# Patient Record
Sex: Male | Born: 1975 | Hispanic: Yes | Marital: Married | Smoking: Never smoker
Health system: Southern US, Community
[De-identification: ages and names within clinical notes are randomized; demographics above are authoritative.]

## PROBLEM LIST (undated history)

## (undated) DIAGNOSIS — M5430 Sciatica, unspecified side: Secondary | ICD-10-CM

## (undated) DIAGNOSIS — M1990 Unspecified osteoarthritis, unspecified site: Secondary | ICD-10-CM

## (undated) DIAGNOSIS — I10 Essential (primary) hypertension: Secondary | ICD-10-CM

## (undated) DIAGNOSIS — E785 Hyperlipidemia, unspecified: Secondary | ICD-10-CM

## (undated) DIAGNOSIS — J45909 Unspecified asthma, uncomplicated: Secondary | ICD-10-CM

## (undated) HISTORY — DX: Hyperlipidemia, unspecified: E78.5

## (undated) HISTORY — DX: Sciatica, unspecified side: M54.30

## (undated) HISTORY — PX: CHOLECYSTECTOMY: SHX55

## (undated) HISTORY — DX: Essential (primary) hypertension: I10

## (undated) HISTORY — DX: Unspecified asthma, uncomplicated: J45.909

## (undated) HISTORY — DX: Unspecified osteoarthritis, unspecified site: M19.90

---

## 2012-05-12 ENCOUNTER — Emergency Department: Payer: Self-pay | Admitting: Emergency Medicine

## 2012-05-12 LAB — COMPREHENSIVE METABOLIC PANEL
Albumin: 4 g/dL (ref 3.4–5.0)
Anion Gap: 13 (ref 7–16)
BUN: 15 mg/dL (ref 7–18)
Calcium, Total: 8.6 mg/dL (ref 8.5–10.1)
Chloride: 109 mmol/L — ABNORMAL HIGH (ref 98–107)
Co2: 21 mmol/L (ref 21–32)
EGFR (African American): >60
Glucose: 111 mg/dL — ABNORMAL HIGH (ref 65–99)
Osmolality: 287 (ref 275–301)
Potassium: 4.2 mmol/L (ref 3.5–5.1)
SGOT(AST): 60 U/L — ABNORMAL HIGH (ref 15–37)
SGPT (ALT): 41 U/L (ref 12–78)
Sodium: 143 mmol/L (ref 136–145)
Total Protein: 7.4 g/dL (ref 6.4–8.2)

## 2012-05-12 LAB — CBC
HGB: 13.7 g/dL (ref 13.0–18.0)
MCV: 90 fL (ref 80–100)
RBC: 4.58 10*6/uL (ref 4.40–5.90)
WBC: 7.4 10*3/uL (ref 3.8–10.6)

## 2012-05-12 LAB — LIPASE, BLOOD: Lipase: 218 U/L (ref 73–393)

## 2012-06-14 ENCOUNTER — Ambulatory Visit: Payer: Self-pay | Admitting: Surgery

## 2012-06-14 LAB — HEPATIC FUNCTION PANEL A (ARMC)
Albumin: 4.2 g/dL (ref 3.4–5.0)
Bilirubin, Direct: <0.05 mg/dL (ref 0.00–0.20)
Bilirubin,Total: 0.8 mg/dL (ref 0.2–1.0)
SGPT (ALT): 28 U/L (ref 12–78)
Total Protein: 7.6 g/dL (ref 6.4–8.2)

## 2012-06-18 ENCOUNTER — Ambulatory Visit: Payer: Self-pay | Admitting: Surgery

## 2012-06-19 LAB — PATHOLOGY REPORT

## 2013-04-19 ENCOUNTER — Ambulatory Visit: Payer: Self-pay

## 2013-04-24 ENCOUNTER — Ambulatory Visit: Payer: Self-pay

## 2014-11-11 NOTE — Op Note (Signed)
PATIENT NAME:  Angel Montes, Angel Montes MR#:  [id number] DATE OF BIRTH:  1975-12-16  DATE OF PROCEDURE:  06/18/2012  OPERATION PERFORMED: Laparoscopic cholecystectomy.   PREOPERATIVE DIAGNOSIS: Symptomatic cholelithiasis.   POSTOPERATIVE DIAGNOSIS: Symptomatic cholelithiasis.   SURGEON: Consuela Mimes, MD   ANESTHESIA: General.   PROCEDURE IN DETAIL: The patient was placed supine on the operating room table and prepped and draped in the usual sterile fashion. A 15 mmHg CO2 pneumoperitoneum was created via a Veress needle in the infraumbilical position and this was replaced with a 5 mm trocar and a 30 degree angled laparoscope. Remaining trocars were placed under direct visualization. The fundus of the gallbladder was retracted superiorly and ventrally and there was a significant amount of fat surrounding the infundibulum and this was dissected off and then the infundibulum was retracted laterally opening up the triangle of Calot. The infundibulum coned down to a cystic duct with no clear gallbladder cystic duct junction but after the cystic duct was fairly small and it was clear that that was not the common bile duct, it was doubly clipped and divided and then the cystic artery was doubly clipped and divided and an additional third clip was placed on a posterior cystic artery branch. The gallbladder was removed from the liver bed with electrocautery, placed in an EndoCatch bag, and extracted from the abdomen via the epigastric port. This port site fascia was then closed with a laparoscopic puncture closure device, 0 Vicryl, and the right upper quadrant was irrigated with warm normal saline. This was suctioned clear. Hemostasis was excellent. There was no bile staining and the clips were secure. Therefore, the peritoneum was desufflated and decannulated and all four skin sites were closed with subcuticular 5-0 Monocryl and suture strips. The patient tolerated the procedure well. There were no complications.    ____________________________ Consuela Mimes, MD wfm:[id number] D: 06/18/2012 09:00:59 ET T: 06/18/2012 10:20:33 ET JOB#: [id number] cc: Consuela Mimes, MD, <Dictator> Consuela Mimes MD ELECTRONICALLY SIGNED 06/20/2012 21:15

## 2016-12-06 DIAGNOSIS — E785 Hyperlipidemia, unspecified: Secondary | ICD-10-CM | POA: Insufficient documentation

## 2016-12-06 DIAGNOSIS — I10 Essential (primary) hypertension: Secondary | ICD-10-CM | POA: Insufficient documentation

## 2017-01-05 ENCOUNTER — Encounter: Payer: Self-pay | Admitting: Family Medicine

## 2017-01-05 ENCOUNTER — Ambulatory Visit (INDEPENDENT_AMBULATORY_CARE_PROVIDER_SITE_OTHER): Payer: BC Managed Care – PPO | Admitting: Family Medicine

## 2017-01-05 VITALS — BP 118/70 | HR 78 | Temp 98.7°F | Ht 65.1 in | Wt 186.2 lb

## 2017-01-05 DIAGNOSIS — Z113 Encounter for screening for infections with a predominantly sexual mode of transmission: Secondary | ICD-10-CM

## 2017-01-05 DIAGNOSIS — I10 Essential (primary) hypertension: Secondary | ICD-10-CM

## 2017-01-05 DIAGNOSIS — E782 Mixed hyperlipidemia: Secondary | ICD-10-CM

## 2017-01-05 NOTE — Progress Notes (Signed)
BP 118/70   Pulse 78   Temp 98.7 F (37.1 C)   Ht 5' 5.1" (1.654 m)   Wt 186 lb 3.2 oz (84.5 kg)   SpO2 97%   BMI 30.89 kg/m    Subjective:    Patient ID: Angel Montes, male    DOB: 09-Oct-1975, 41 y.o.   MRN: [id number]  HPI: Nial Hawe is a 41 y.o. male who presents today to establish care. He has not seen a regular doctor in about 3 years  Chief Complaint  Patient presents with  . Establish Care   HYPERTENSION / Backus Satisfied with current treatment? yes Duration of hypertension: chronic BP monitoring frequency: not checking BP medication side effects: Not on anything Past BP meds: none Duration of hyperlipidemia: chronic Cholesterol medication side effects: no Cholesterol supplements: none Past cholesterol medications: fenofibrate (tricor), niaspan and lovaza Medication compliance: Not on anything in 3 years Aspirin: no Recent stressors: no Recurrent headaches: no Visual changes: no Palpitations: no Dyspnea: no Chest pain: no Lower extremity edema: no Dizzy/lightheaded: no   Active Ambulatory Problems    Diagnosis Date Noted  . Hyperlipidemia   . Hypertension    Resolved Ambulatory Problems    Diagnosis Date Noted  . No Resolved Ambulatory Problems   Past Medical History:  Diagnosis Date  . Asthma   . Hyperlipidemia   . Hypertension   . Sciatica    Past Surgical History:  Procedure Laterality Date  . CHOLECYSTECTOMY     No outpatient encounter prescriptions on file as of 01/05/2017.   No facility-administered encounter medications on file as of 01/05/2017.    No Known Allergies  Family History  Problem Relation Age of Onset  . Hypertension Mother    Social History   Social History  . Marital status: Married    Spouse name: N/A  . Number of children: N/A  . Years of education: N/A   Occupational History  . Not on file.   Social History Main Topics  . Smoking status: Never Smoker  . Smokeless tobacco: Never Used    . Alcohol use Yes     Comment: On occasion/ socially  . Drug use: No  . Sexual activity: Yes   Other Topics Concern  . Not on file   Social History Narrative  . No narrative on file    Review of Systems  Constitutional: Negative.   Respiratory: Negative.   Cardiovascular: Negative.   Psychiatric/Behavioral: Negative.     Per HPI unless specifically indicated above     Objective:    BP 118/70   Pulse 78   Temp 98.7 F (37.1 C)   Ht 5' 5.1" (1.654 m)   Wt 186 lb 3.2 oz (84.5 kg)   SpO2 97%   BMI 30.89 kg/m   Wt Readings from Last 3 Encounters:  01/05/17 186 lb 3.2 oz (84.5 kg)    Physical Exam  Constitutional: He is oriented to person, place, and time. He appears well-developed and well-nourished. No distress.  HENT:  Head: Normocephalic and atraumatic.  Right Ear: Hearing normal.  Left Ear: Hearing normal.  Nose: Nose normal.  Eyes: Conjunctivae and lids are normal. Right eye exhibits no discharge. Left eye exhibits no discharge. No scleral icterus.  Cardiovascular: Normal rate, regular rhythm, normal heart sounds and intact distal pulses.  Exam reveals no gallop and no friction rub.   No murmur heard. Pulmonary/Chest: Effort normal and breath sounds normal. No respiratory distress. He has no wheezes. He has  no rales. He exhibits no tenderness.  Musculoskeletal: Normal range of motion.  Neurological: He is alert and oriented to person, place, and time.  Skin: Skin is warm, dry and intact. No rash noted. He is not diaphoretic. No erythema. No pallor.  Psychiatric: He has a normal mood and affect. His speech is normal and behavior is normal. Judgment and thought content normal. Cognition and memory are normal.  Nursing note and vitals reviewed.   Results for orders placed or performed in visit on 05/12/12  Comprehensive metabolic panel  Result Value Ref Range   Glucose 111 (H) 65 - 99 mg/dL   BUN 15 7 - 18 mg/dL   Creatinine 0.84 0.60 - 1.30 mg/dL   Sodium  143 136 - 145 mmol/L   Potassium 4.2 3.5 - 5.1 mmol/L   Chloride 109 (H) 98 - 107 mmol/L   Co2 21 21 - 32 mmol/L   Calcium, Total 8.6 8.5 - 10.1 mg/dL   SGOT(AST) 60 (H) 15 - 37 Unit/L   SGPT (ALT) 41 12 - 78 U/L   Alkaline Phosphatase 75 50 - 136 Unit/L   Albumin 4.0 3.4 - 5.0 g/dL   Total Protein 7.4 6.4 - 8.2 g/dL   Bilirubin,Total 0.4 0.2 - 1.0 mg/dL   Osmolality 287 275 - 301   Anion Gap 13 7 - 16   EGFR (African American) >60    EGFR (Non-African Amer.) >60   CBC  Result Value Ref Range   WBC 7.4 3.8 - 10.6 x10 3/mm 3   RBC 4.58 4.40 - 5.90 x10 6/mm 3   HGB 13.7 13.0 - 18.0 g/dL   HCT 41.3 40.0 - 52.0 %   MCV 90 80 - 100 fL   MCH 29.9 26.0 - 34.0 pg   MCHC 33.2 32.0 - 36.0 g/dL   RDW 13.5 11.5 - 14.5 %   Platelet 246 150 - 440 x10 3/mm 3  Lipase, blood  Result Value Ref Range   Lipase 218 73 - 393 Unit/L  Troponin I  Result Value Ref Range   Troponin-I < 0.02 ng/mL  CK total and CKMB (cardiac)  Result Value Ref Range   CK, Total 227 35 - 232 Unit/L   CK-MB < 0.5 (L) 0.5 - 3.6 ng/mL      Assessment & Plan:   Problem List Items Addressed This Visit      Cardiovascular and Mediastinum   Hypertension - Primary    Under good control on recheck- will check microalbumin and recheck at follow up. Work on [employer].       Relevant Orders   CBC with Differential/Platelet   Comprehensive metabolic panel   Microalbumin, Urine Waived   TSH   UA/M w/rflx Culture, Routine     Other   Hyperlipidemia    Ate today. Will return tomorrow for blood work and will treat as needed. Call with any concerns.       Relevant Orders   CBC with Differential/Platelet   Comprehensive metabolic panel   Lipid Panel w/o Chol/HDL Ratio    Other Visit Diagnoses    Screening for STD (sexually transmitted disease)       Labs to be done tomorrow. Call with any concerns.    Relevant Orders   HIV antibody       Follow up plan: Return 1-3 months, for Physical.

## 2017-01-05 NOTE — Patient Instructions (Addendum)
DASH Eating Plan DASH stands for "Dietary Approaches to Stop Hypertension." The DASH eating plan is a healthy eating plan that has been shown to reduce high blood pressure (hypertension). It may also reduce your risk for type 2 diabetes, heart disease, and stroke. The DASH eating plan may also help with weight loss. What are tips for following this plan? General guidelines  Avoid eating more than 2,300 mg (milligrams) of salt (sodium) a day. If you have hypertension, you may need to reduce your sodium intake to 1,500 mg a day.  Limit alcohol intake to no more than 1 drink a day for nonpregnant women and 2 drinks a day for men. One drink equals 12 oz of beer, 5 oz of wine, or 1 oz of hard liquor.  Work with your health care provider to maintain a healthy body weight or to lose weight. Ask what an ideal weight is for you.  Get at least 30 minutes of exercise that causes your heart to beat faster (aerobic exercise) most days of the week. Activities may include walking, swimming, or biking.  Work with your health care provider or diet and nutrition specialist (dietitian) to adjust your eating plan to your individual calorie needs. Reading food labels  Check food labels for the amount of sodium per serving. Choose foods with less than 5 percent of the Daily Value of sodium. Generally, foods with less than 300 mg of sodium per serving fit into this eating plan.  To find whole grains, look for the word "whole" as the first word in the ingredient list. Shopping  Buy products labeled as "low-sodium" or "no salt added."  Buy fresh foods. Avoid canned foods and premade or frozen meals. Cooking  Avoid adding salt when cooking. Use salt-free seasonings or herbs instead of table salt or sea salt. Check with your health care provider or pharmacist before using salt substitutes.  Do not fry foods. Cook foods using healthy methods such as baking, boiling, grilling, and broiling instead.  Cook with  heart-healthy oils, such as olive, canola, soybean, or sunflower oil. Meal planning   Eat a balanced diet that includes: ? 5 or more servings of fruits and vegetables each day. At each meal, try to fill half of your plate with fruits and vegetables. ? Up to 6-8 servings of whole grains each day. ? Less than 6 oz of lean meat, poultry, or fish each day. A 3-oz serving of meat is about the same size as a deck of cards. One egg equals 1 oz. ? 2 servings of low-fat dairy each day. ? A serving of nuts, seeds, or beans 5 times each week. ? Heart-healthy fats. Healthy fats called Omega-3 fatty acids are found in foods such as flaxseeds and coldwater fish, like sardines, salmon, and mackerel.  Limit how much you eat of the following: ? Canned or prepackaged foods. ? Food that is high in trans fat, such as fried foods. ? Food that is high in saturated fat, such as fatty meat. ? Sweets, desserts, sugary drinks, and other foods with added sugar. ? Full-fat dairy products.  Do not salt foods before eating.  Try to eat at least 2 vegetarian meals each week.  Eat more home-cooked food and less restaurant, buffet, and fast food.  When eating at a restaurant, ask that your food be prepared with less salt or no salt, if possible. What foods are recommended? The items listed may not be a complete list. Talk with your dietitian about what   dietary choices are best for you. Grains Whole-grain or whole-wheat bread. Whole-grain or whole-wheat pasta. Brown rice. Oatmeal. Quinoa. Bulgur. Whole-grain and low-sodium cereals. Pita bread. Low-fat, low-sodium crackers. Whole-wheat flour tortillas. Vegetables Fresh or frozen vegetables (raw, steamed, roasted, or grilled). Low-sodium or reduced-sodium tomato and vegetable juice. Low-sodium or reduced-sodium tomato sauce and tomato paste. Low-sodium or reduced-sodium canned vegetables. Fruits All fresh, dried, or frozen fruit. Canned fruit in natural juice (without  added sugar). Meat and other protein foods Skinless chicken or turkey. Ground chicken or turkey. Pork with fat trimmed off. Fish and seafood. Egg whites. Dried beans, peas, or lentils. Unsalted nuts, nut butters, and seeds. Unsalted canned beans. Lean cuts of beef with fat trimmed off. Low-sodium, lean deli meat. Dairy Low-fat (1%) or fat-free (skim) milk. Fat-free, low-fat, or reduced-fat cheeses. Nonfat, low-sodium ricotta or cottage cheese. Low-fat or nonfat yogurt. Low-fat, low-sodium cheese. Fats and oils Soft margarine without trans fats. Vegetable oil. Low-fat, reduced-fat, or light mayonnaise and salad dressings (reduced-sodium). Canola, safflower, olive, soybean, and sunflower oils. Avocado. Seasoning and other foods Herbs. Spices. Seasoning mixes without salt. Unsalted popcorn and pretzels. Fat-free sweets. What foods are not recommended? The items listed may not be a complete list. Talk with your dietitian about what dietary choices are best for you. Grains Baked goods made with fat, such as croissants, muffins, or some breads. Dry pasta or rice meal packs. Vegetables Creamed or fried vegetables. Vegetables in a cheese sauce. Regular canned vegetables (not low-sodium or reduced-sodium). Regular canned tomato sauce and paste (not low-sodium or reduced-sodium). Regular tomato and vegetable juice (not low-sodium or reduced-sodium). Pickles. Olives. Fruits Canned fruit in a light or heavy syrup. Fried fruit. Fruit in cream or butter sauce. Meat and other protein foods Fatty cuts of meat. Ribs. Fried meat. Bacon. Sausage. Bologna and other processed lunch meats. Salami. Fatback. Hotdogs. Bratwurst. Salted nuts and seeds. Canned beans with added salt. Canned or smoked fish. Whole eggs or egg yolks. Chicken or turkey with skin. Dairy Whole or 2% milk, cream, and half-and-half. Whole or full-fat cream cheese. Whole-fat or sweetened yogurt. Full-fat cheese. Nondairy creamers. Whipped toppings.  Processed cheese and cheese spreads. Fats and oils Butter. Stick margarine. Lard. Shortening. Ghee. Bacon fat. Tropical oils, such as coconut, palm kernel, or palm oil. Seasoning and other foods Salted popcorn and pretzels. Onion salt, garlic salt, seasoned salt, table salt, and sea salt. Worcestershire sauce. Tartar sauce. Barbecue sauce. Teriyaki sauce. Soy sauce, including reduced-sodium. Steak sauce. Canned and packaged gravies. Fish sauce. Oyster sauce. Cocktail sauce. Horseradish that you find on the shelf. Ketchup. Mustard. Meat flavorings and tenderizers. Bouillon cubes. Hot sauce and Tabasco sauce. Premade or packaged marinades. Premade or packaged taco seasonings. Relishes. Regular salad dressings. Where to find more information:  National Heart, Lung, and Blood Institute: www.nhlbi.nih.gov  American Heart Association: www.heart.org Summary  The DASH eating plan is a healthy eating plan that has been shown to reduce high blood pressure (hypertension). It may also reduce your risk for type 2 diabetes, heart disease, and stroke.  With the DASH eating plan, you should limit salt (sodium) intake to 2,300 mg a day. If you have hypertension, you may need to reduce your sodium intake to 1,500 mg a day.  When on the DASH eating plan, aim to eat more fresh fruits and vegetables, whole grains, lean proteins, low-fat dairy, and heart-healthy fats.  Work with your health care provider or diet and nutrition specialist (dietitian) to adjust your eating plan to your individual   calorie needs. This information is not intended to replace advice given to you by your health care provider. Make sure you discuss any questions you have with your health care provider. Document Released: 06/30/2011 Document Revised: 07/04/2016 Document Reviewed: 07/04/2016 Elsevier Interactive Patient Education  2017 [employer].  

## 2017-01-05 NOTE — Assessment & Plan Note (Signed)
Under good control on recheck- will check microalbumin and recheck at follow up. Work on [employer].

## 2017-01-05 NOTE — Assessment & Plan Note (Signed)
Ate today. Will return tomorrow for blood work and will treat as needed. Call with any concerns.

## 2017-01-06 ENCOUNTER — Other Ambulatory Visit: Payer: BC Managed Care – PPO

## 2017-01-06 DIAGNOSIS — Z113 Encounter for screening for infections with a predominantly sexual mode of transmission: Secondary | ICD-10-CM

## 2017-01-06 DIAGNOSIS — E782 Mixed hyperlipidemia: Secondary | ICD-10-CM

## 2017-01-06 DIAGNOSIS — I10 Essential (primary) hypertension: Secondary | ICD-10-CM

## 2017-01-06 LAB — MICROSCOPIC EXAMINATION
BACTERIA UA: NONE SEEN
RBC, UA: NONE SEEN /hpf (ref 0–?)

## 2017-01-06 LAB — UA/M W/RFLX CULTURE, ROUTINE
Bilirubin, UA: NEGATIVE
Glucose, UA: NEGATIVE
Ketones, UA: NEGATIVE
Leukocytes, UA: NEGATIVE
Nitrite, UA: NEGATIVE
PH UA: 5.5 (ref 5.0–7.5)
Protein, UA: NEGATIVE
RBC, UA: NEGATIVE
Specific Gravity, UA: 1.025 (ref 1.005–1.030)
UUROB: 0.2 mg/dL (ref 0.2–1.0)

## 2017-01-06 LAB — MICROALBUMIN, URINE WAIVED
Creatinine, Urine Waived: 300 mg/dL (ref 10–300)
Microalb, Ur Waived: 30 mg/L — ABNORMAL HIGH (ref 0–19)
Microalb/Creat Ratio: <30 mg/g (ref <30)

## 2017-01-07 LAB — COMPREHENSIVE METABOLIC PANEL
A/G RATIO: 2.1 (ref 1.2–2.2)
ALT: 25 IU/L (ref 0–44)
AST: 20 IU/L (ref 0–40)
Albumin: 4.8 g/dL (ref 3.5–5.5)
Alkaline Phosphatase: 65 IU/L (ref 39–117)
BUN/Creatinine Ratio: 13 (ref 9–20)
BUN: 12 mg/dL (ref 6–24)
Bilirubin Total: 0.4 mg/dL (ref 0.0–1.2)
CHLORIDE: 102 mmol/L (ref 96–106)
CO2: 20 mmol/L (ref 20–29)
Calcium: 9.1 mg/dL (ref 8.7–10.2)
Creatinine, Ser: 0.9 mg/dL (ref 0.76–1.27)
GFR calc Af Amer: 121 mL/min/{1.73_m2} (ref >59)
GFR calc non Af Amer: 105 mL/min/{1.73_m2} (ref >59)
GLOBULIN, TOTAL: 2.3 g/dL (ref 1.5–4.5)
Glucose: 97 mg/dL (ref 65–99)
POTASSIUM: 4.5 mmol/L (ref 3.5–5.2)
SODIUM: 140 mmol/L (ref 134–144)
Total Protein: 7.1 g/dL (ref 6.0–8.5)

## 2017-01-07 LAB — TSH: TSH: 2.06 u[IU]/mL (ref 0.450–4.500)

## 2017-01-07 LAB — CBC WITH DIFFERENTIAL/PLATELET
Basophils Absolute: 0 10*3/uL (ref 0.0–0.2)
Basos: 1 %
EOS (ABSOLUTE): 0.1 10*3/uL (ref 0.0–0.4)
EOS: 2 %
HEMATOCRIT: 44.4 % (ref 37.5–51.0)
Hemoglobin: 15.5 g/dL (ref 13.0–17.7)
Immature Grans (Abs): 0 10*3/uL (ref 0.0–0.1)
Immature Granulocytes: 0 %
LYMPHS ABS: 1.7 10*3/uL (ref 0.7–3.1)
Lymphs: 32 %
MCH: 31.3 pg (ref 26.6–33.0)
MCHC: 34.9 g/dL (ref 31.5–35.7)
MCV: 90 fL (ref 79–97)
MONOS ABS: 0.4 10*3/uL (ref 0.1–0.9)
Monocytes: 7 %
Neutrophils Absolute: 3.1 10*3/uL (ref 1.4–7.0)
Neutrophils: 58 %
Platelets: 144 10*3/uL — ABNORMAL LOW (ref 150–379)
RBC: 4.95 x10E6/uL (ref 4.14–5.80)
RDW: 14.9 % (ref 12.3–15.4)
WBC: 5.3 10*3/uL (ref 3.4–10.8)

## 2017-01-07 LAB — LIPID PANEL W/O CHOL/HDL RATIO
CHOLESTEROL TOTAL: 186 mg/dL (ref 100–199)
HDL: 16 mg/dL — ABNORMAL LOW (ref >39)
TRIGLYCERIDES: 1408 mg/dL — AB (ref 0–149)

## 2017-01-07 LAB — HIV ANTIBODY (ROUTINE TESTING W REFLEX): HIV Screen 4th Generation wRfx: NONREACTIVE

## 2017-01-10 ENCOUNTER — Other Ambulatory Visit: Payer: Self-pay | Admitting: Family Medicine

## 2017-01-10 MED ORDER — OMEGA-3-ACID ETHYL ESTERS 1 G PO CAPS: 2.0000 g | ORAL_CAPSULE | Freq: Two times a day (BID) | ORAL | 3 refills | Status: DC

## 2017-01-10 NOTE — Progress Notes (Signed)
lovaza sent to his pharmacy

## 2017-02-13 ENCOUNTER — Ambulatory Visit
Admission: RE | Admit: 2017-02-13 | Discharge: 2017-02-13 | Disposition: A | Discharge status: Home / Self Care | Payer: BC Managed Care – PPO | Source: Ambulatory Visit | Attending: Family Medicine | Admitting: Family Medicine

## 2017-02-13 ENCOUNTER — Ambulatory Visit (INDEPENDENT_AMBULATORY_CARE_PROVIDER_SITE_OTHER): Payer: BC Managed Care – PPO | Admitting: Family Medicine

## 2017-02-13 ENCOUNTER — Encounter: Payer: Self-pay | Admitting: Family Medicine

## 2017-02-13 VITALS — BP 119/83 | HR 83 | Temp 99.1°F | Wt 191.0 lb

## 2017-02-13 DIAGNOSIS — M79671 Pain in right foot: Secondary | ICD-10-CM

## 2017-02-13 MED ORDER — CYCLOBENZAPRINE HCL 10 MG PO TABS: 10.0000 mg | ORAL_TABLET | Freq: Three times a day (TID) | ORAL | 0 refills | Status: DC | PRN

## 2017-02-13 MED ORDER — PREDNISONE 20 MG PO TABS: 40.0000 mg | ORAL_TABLET | Freq: Every day | ORAL | 0 refills | Status: DC

## 2017-02-13 NOTE — Progress Notes (Signed)
   BP 119/83   Pulse 83   Temp 99.1 F (37.3 C)   Wt 191 lb (86.6 kg)   SpO2 96%   BMI 31.69 kg/m    Subjective:    Patient ID: Angel Montes, male    DOB: 1975-12-31, 41 y.o.   MRN: [id number]  HPI: Angel Montes is a 41 y.o. male  Chief Complaint  Patient presents with  . Heel Pain    right heel, jumped off elevated surface 10 days ago and landed wrong. It is improving.   . Leg Pain    left leg, muscle area around his shin and calf has been hurting.Feels tight if he goes to straighten it out.    Patient presents with right heel pain and left shin pain following a jump from about 5 feet elevation 10 days ago. Notes the pain is slowly resolving, but still having sharp heel pain with weight bearing and aching pain up left shin, particularly on medial aspect. Taking advil and using ice with good relief and has been massaging the areas. No bruising or swelling, numbness, tingling, decreased motion noted.   Relevant past medical, surgical, family and social history reviewed and updated as indicated. Interim medical history since our last visit reviewed. Allergies and medications reviewed and updated.  Review of Systems  Constitutional: Negative.   Respiratory: Negative.   Cardiovascular: Negative.   Gastrointestinal: Negative.   Musculoskeletal: Positive for arthralgias and myalgias.  Neurological: Negative.   Psychiatric/Behavioral: Negative.    Per HPI unless specifically indicated above     Objective:    BP 119/83   Pulse 83   Temp 99.1 F (37.3 C)   Wt 191 lb (86.6 kg)   SpO2 96%   BMI 31.69 kg/m   Wt Readings from Last 3 Encounters:  02/13/17 191 lb (86.6 kg)  01/05/17 186 lb 3.2 oz (84.5 kg)    Physical Exam  Constitutional: He is oriented to person, place, and time. He appears well-developed and well-nourished. No distress.  HENT:  Head: Atraumatic.  Eyes: Pupils are equal, round, and reactive to light. Conjunctivae are normal.  Neck: Normal range of  motion. Neck supple.  Cardiovascular: Normal rate, normal heart sounds and intact distal pulses.   Pulmonary/Chest: Effort normal and breath sounds normal. No respiratory distress.  Musculoskeletal: Normal range of motion. He exhibits tenderness (mild ttp over medial left shin ). He exhibits no edema or deformity.  Strength 5/5 b/l LEs  Neurological: He is alert and oriented to person, place, and time.  Skin: Skin is warm and dry.  No bruising or abrasions noted  Psychiatric: He has a normal mood and affect. His behavior is normal.  Nursing note and vitals reviewed.     Assessment & Plan:   Problem List Items Addressed This Visit    None    Visit Diagnoses    Foot pain, right    -  Primary   Relevant Orders   DG Foot Complete Right (Completed)    Will obtain right foot x-ray to ensure no fragments or other fx's from impact to right heel given persistent sharp pain. Prednisone, flexeril, ice, massage, epsom salt soaks. Suspect some muscular strain causing the left shin discomfort.    Follow up plan: Return if symptoms worsen or fail to improve.

## 2017-02-14 NOTE — Patient Instructions (Signed)
Follow up if no improvement 

## 2017-03-07 ENCOUNTER — Encounter: Payer: BC Managed Care – PPO | Admitting: Family Medicine

## 2018-06-07 ENCOUNTER — Ambulatory Visit (INDEPENDENT_AMBULATORY_CARE_PROVIDER_SITE_OTHER): Payer: BC Managed Care – PPO | Admitting: Family Medicine

## 2018-06-07 ENCOUNTER — Encounter: Payer: Self-pay | Admitting: Family Medicine

## 2018-06-07 VITALS — BP 133/80 | HR 82 | Temp 98.5°F | Ht 65.3 in | Wt 190.6 lb

## 2018-06-07 DIAGNOSIS — Z0000 Encounter for general adult medical examination without abnormal findings: Secondary | ICD-10-CM | POA: Diagnosis not present

## 2018-06-07 DIAGNOSIS — E782 Mixed hyperlipidemia: Secondary | ICD-10-CM

## 2018-06-07 DIAGNOSIS — Z23 Encounter for immunization: Secondary | ICD-10-CM | POA: Diagnosis not present

## 2018-06-07 DIAGNOSIS — L989 Disorder of the skin and subcutaneous tissue, unspecified: Secondary | ICD-10-CM | POA: Diagnosis not present

## 2018-06-07 DIAGNOSIS — M79671 Pain in right foot: Secondary | ICD-10-CM

## 2018-06-07 DIAGNOSIS — I10 Essential (primary) hypertension: Secondary | ICD-10-CM

## 2018-06-07 LAB — UA/M W/RFLX CULTURE, ROUTINE
Bilirubin, UA: NEGATIVE
GLUCOSE, UA: NEGATIVE
Ketones, UA: NEGATIVE
Leukocytes, UA: NEGATIVE
Nitrite, UA: NEGATIVE
PH UA: 7 (ref 5.0–7.5)
PROTEIN UA: NEGATIVE
RBC, UA: NEGATIVE
Specific Gravity, UA: 1.02 (ref 1.005–1.030)
Urobilinogen, Ur: 1 mg/dL (ref 0.2–1.0)

## 2018-06-07 LAB — MICROALBUMIN, URINE WAIVED
CREATININE, URINE WAIVED: 200 mg/dL (ref 10–300)
MICROALB, UR WAIVED: 30 mg/L — AB (ref 0–19)
Microalb/Creat Ratio: <30 mg/g (ref <30)

## 2018-06-07 NOTE — Progress Notes (Signed)
BP 133/80   Pulse 82   Temp 98.5 F (36.9 C) (Oral)   Ht 5' 5.3" (1.659 m)   Wt 190 lb 9.6 oz (86.5 kg)   SpO2 98%   BMI 31.43 kg/m    Subjective:    Patient ID: Angel Montes, male    DOB: 14-Oct-1975, 42 y.o.   MRN: [id number]  HPI: Angel Montes is a 42 y.o. male presenting on 06/07/2018 for comprehensive medical examination. Current medical complaints include:  Has been having some numbness and tingling in his R big toe with some pain. Has been feeling better now, but worse with flexing his foot.  HYPERTENSION / HYPERLIPIDEMIA Satisfied with current treatment? yes Duration of hypertension: chronic BP monitoring frequency: not checking BP medication side effects: not on anything Past BP meds: none Duration of hyperlipidemia: chronic Cholesterol medication side effects: not on anything Cholesterol supplements: none Past cholesterol medications: lovaza Medication compliance: poor compliance Aspirin: no Recent stressors: no Recurrent headaches: no Visual changes: no Palpitations: no Dyspnea: no Chest pain: no Lower extremity edema: no Dizzy/lightheaded: no   Patient would like to see dermatology- has a rash in his groin that is dry and irritated  Interim Problems from his last visit: no  Depression Screen done today and results listed below:  Depression screen [facility] 2/9 06/07/2018 01/05/2017  Decreased Interest 0 0  Down, Depressed, Hopeless 0 0  PHQ - 2 Score 0 0  Altered sleeping 1 -  Tired, decreased energy 0 -  Change in appetite 0 -  Feeling bad or failure about yourself  0 -  Trouble concentrating 0 -  Moving slowly or fidgety/restless 0 -  Suicidal thoughts 0 -  PHQ-9 Score 1 -   Past Medical History:  Past Medical History:  Diagnosis Date  . Asthma   . Hyperlipidemia   . Hypertension   . Sciatica     Surgical History:  Past Surgical History:  Procedure Laterality Date  . CHOLECYSTECTOMY      Medications:  No current outpatient  medications on file prior to visit.   No current facility-administered medications on file prior to visit.     Allergies:  No Known Allergies  Social History:  Social History   Socioeconomic History  . Marital status: Married    Spouse name: Not on file  . Number of children: Not on file  . Years of education: Not on file  . Highest education level: Not on file  Occupational History  . Not on file  Social Needs  . Financial resource strain: Not on file  . Food insecurity:    Worry: Not on file    Inability: Not on file  . Transportation needs:    Medical: Not on file    Non-medical: Not on file  Tobacco Use  . Smoking status: Never Smoker  . Smokeless tobacco: Never Used  Substance and Sexual Activity  . Alcohol use: Yes    Comment: On occasion/ socially  . Drug use: No  . Sexual activity: Yes  Lifestyle  . Physical activity:    Days per week: Not on file    Minutes per session: Not on file  . Stress: Not on file  Relationships  . Social connections:    Talks on phone: Not on file    Gets together: Not on file    Attends religious service: Not on file    Active member of club or organization: Not on file    Attends meetings of  clubs or organizations: Not on file    Relationship status: Not on file  . Intimate partner violence:    Fear of current or ex partner: Not on file    Emotionally abused: Not on file    Physically abused: Not on file    Forced sexual activity: Not on file  Other Topics Concern  . Not on file  Social History Narrative  . Not on file   Social History   Tobacco Use  Smoking Status Never Smoker  Smokeless Tobacco Never Used   Social History   Substance and Sexual Activity  Alcohol Use Yes   Comment: On occasion/ socially    Family History:  Family History  Problem Relation Age of Onset  . Hypertension Mother     Past medical history, surgical history, medications, allergies, family history and social history reviewed with  patient today and changes made to appropriate areas of the chart.   Review of Systems  Constitutional: Negative.   HENT: Negative.   Eyes: Negative.   Respiratory: Negative.   Cardiovascular: Negative.   Gastrointestinal: Negative.   Genitourinary: Negative.   Musculoskeletal: Negative.   Skin: Positive for rash. Negative for itching.  Neurological: Negative.   Endo/Heme/Allergies: Negative.   Psychiatric/Behavioral: Negative.     All other ROS negative except what is listed above and in the HPI.      Objective:    BP 133/80   Pulse 82   Temp 98.5 F (36.9 C) (Oral)   Ht 5' 5.3" (1.659 m)   Wt 190 lb 9.6 oz (86.5 kg)   SpO2 98%   BMI 31.43 kg/m   Wt Readings from Last 3 Encounters:  06/07/18 190 lb 9.6 oz (86.5 kg)  02/13/17 191 lb (86.6 kg)  01/05/17 186 lb 3.2 oz (84.5 kg)    Physical Exam  Constitutional: He is oriented to person, place, and time. He appears well-developed and well-nourished. No distress.  HENT:  Head: Normocephalic and atraumatic.  Right Ear: Hearing, tympanic membrane, external ear and ear canal normal.  Left Ear: Hearing, tympanic membrane, external ear and ear canal normal.  Nose: Nose normal.  Mouth/Throat: Uvula is midline, oropharynx is clear and moist and mucous membranes are normal. No oropharyngeal exudate.  Eyes: Pupils are equal, round, and reactive to light. Conjunctivae, EOM and lids are normal. Right eye exhibits no discharge. Left eye exhibits no discharge. No scleral icterus.  Neck: Normal range of motion. Neck supple. No JVD present. No tracheal deviation present. No thyromegaly present.  Cardiovascular: Normal rate, regular rhythm, normal heart sounds and intact distal pulses. Exam reveals no gallop and no friction rub.  No murmur heard. Pulmonary/Chest: Effort normal and breath sounds normal. No stridor. No respiratory distress. He has no wheezes. He has no rales. He exhibits no tenderness.  Abdominal: Soft. Bowel sounds are  normal. He exhibits no distension and no mass. There is no tenderness. There is no rebound and no guarding. No hernia.  Genitourinary:  Genitourinary Comments: Genital exam not performed   Musculoskeletal: Normal range of motion. He exhibits no edema, tenderness or deformity.  Lymphadenopathy:    He has no cervical adenopathy.  Neurological: He is alert and oriented to person, place, and time. He displays normal reflexes. No cranial nerve deficit or sensory deficit. He exhibits normal muscle tone. Coordination normal.  Skin: Skin is warm, dry and intact. Capillary refill takes less than 2 seconds. No rash noted. He is not diaphoretic. No erythema. No pallor.  Psychiatric: He has a normal mood and affect. His speech is normal and behavior is normal. Judgment and thought content normal. Cognition and memory are normal.    Results for orders placed or performed in visit on 01/06/17  Microscopic Examination  Result Value Ref Range   WBC, UA 0-5 0 - 5 /hpf   RBC, UA None seen 0 - 2 /hpf   Epithelial Cells (non renal) 0-10 0 - 10 /hpf   Bacteria, UA None seen None seen/Few  CBC with Differential/Platelet  Result Value Ref Range   WBC 5.3 3.4 - 10.8 x10E3/uL   RBC 4.95 4.14 - 5.80 x10E6/uL   Hemoglobin 15.5 13.0 - 17.7 g/dL   Hematocrit 44.4 37.5 - 51.0 %   MCV 90 79 - 97 fL   MCH 31.3 26.6 - 33.0 pg   MCHC 34.9 31.5 - 35.7 g/dL   RDW 14.9 12.3 - 15.4 %   Platelets 144 (L) 150 - 379 x10E3/uL   Neutrophils 58 Not Estab. %   Lymphs 32 Not Estab. %   Monocytes 7 Not Estab. %   Eos 2 Not Estab. %   Basos 1 Not Estab. %   Neutrophils Absolute 3.1 1.4 - 7.0 x10E3/uL   Lymphocytes Absolute 1.7 0.7 - 3.1 x10E3/uL   Monocytes Absolute 0.4 0.1 - 0.9 x10E3/uL   EOS (ABSOLUTE) 0.1 0.0 - 0.4 x10E3/uL   Basophils Absolute 0.0 0.0 - 0.2 x10E3/uL   Immature Granulocytes 0 Not Estab. %   Immature Grans (Abs) 0.0 0.0 - 0.1 x10E3/uL  Comprehensive metabolic panel  Result Value Ref Range   Glucose 97  65 - 99 mg/dL   BUN 12 6 - 24 mg/dL   Creatinine, Ser 0.90 0.76 - 1.27 mg/dL   GFR calc non Af Amer 105 >59 mL/min/1.73   GFR calc Af Amer 121 >59 mL/min/1.73   BUN/Creatinine Ratio 13 9 - 20   Sodium 140 134 - 144 mmol/L   Potassium 4.5 3.5 - 5.2 mmol/L   Chloride 102 96 - 106 mmol/L   CO2 20 20 - 29 mmol/L   Calcium 9.1 8.7 - 10.2 mg/dL   Total Protein 7.1 6.0 - 8.5 g/dL   Albumin 4.8 3.5 - 5.5 g/dL   Globulin, Total 2.3 1.5 - 4.5 g/dL   Albumin/Globulin Ratio 2.1 1.2 - 2.2   Bilirubin Total 0.4 0.0 - 1.2 mg/dL   Alkaline Phosphatase 65 39 - 117 IU/L   AST 20 0 - 40 IU/L   ALT 25 0 - 44 IU/L  Lipid Panel w/o Chol/HDL Ratio  Result Value Ref Range   Cholesterol, Total 186 100 - 199 mg/dL   Triglycerides 1,408 (HH) 0 - 149 mg/dL   HDL 16 (L) >39 mg/dL   VLDL Cholesterol Cal Comment 5 - 40 mg/dL   LDL Calculated Comment 0 - 99 mg/dL  Microalbumin, Urine Waived  Result Value Ref Range   Microalb, Ur Waived 30 (H) 0 - 19 mg/L   Creatinine, Urine Waived 300 10 - 300 mg/dL   Microalb/Creat Ratio <30 <30 mg/g  TSH  Result Value Ref Range   TSH 2.060 0.450 - 4.500 uIU/mL  UA/M w/rflx Culture, Routine  Result Value Ref Range   Specific Gravity, UA 1.025 1.005 - 1.030   pH, UA 5.5 5.0 - 7.5   Color, UA Yellow Yellow   Appearance Ur Clear Clear   Leukocytes, UA Negative Negative   Protein, UA Negative Negative/Trace   Glucose, UA Negative Negative  Ketones, UA Negative Negative   RBC, UA Negative Negative   Bilirubin, UA Negative Negative   Urobilinogen, Ur 0.2 0.2 - 1.0 mg/dL   Nitrite, UA Negative Negative   Microscopic Examination See below:   HIV antibody  Result Value Ref Range   HIV Screen 4th Generation wRfx Non Reactive Non Reactive      Assessment & Plan:   Problem List Items Addressed This Visit      Cardiovascular and Mediastinum   Hypertension    BP under good control today off medicine. Microalbumin normal. Continue to monitor. Call with any concerns.         Relevant Orders   Microalbumin, Urine Waived     Other   Hyperlipidemia    Has been off medicine. Await results and treat as needed. Call with any concerns.       Relevant Orders   Lipid Panel w/o Chol/HDL Ratio    Other Visit Diagnoses    Routine general medical examination at a health care facility    -  Primary   Vaccines up to date. Screening labs checked today. Continue diet and exercise. Call with any concerns.    Relevant Orders   CBC with Differential/Platelet   Comprehensive metabolic panel   Lipid Panel w/o Chol/HDL Ratio   Microalbumin, Urine Waived   TSH   UA/M w/rflx Culture, Routine   Need for influenza vaccination       Flu shot given today.   Relevant Orders   Flu Vaccine QUAD 36+ mos IM (Completed)   Skin lesion       Would like to see dermatology. Referral generated today.   Relevant Orders   Ambulatory referral to Dermatology   Foot pain, right       Exercises given. Call if not getting better or getting worse.        Discussed aspirin prophylaxis for myocardial infarction prevention and decision was it was not indicated  LABORATORY TESTING:  Health maintenance labs ordered today as discussed above.   IMMUNIZATIONS:   - Tdap: Tetanus vaccination status reviewed: last tetanus booster within 10 years. - Influenza: Up to date - Pneumovax: Not applicable  PATIENT COUNSELING:    Sexuality: Discussed sexually transmitted diseases, partner selection, use of condoms, avoidance of unintended pregnancy  and contraceptive alternatives.   Advised to avoid cigarette smoking.  I discussed with the patient that most people either abstain from alcohol or drink within safe limits (<=14/week and <=4 drinks/occasion for males, <=7/weeks and <= 3 drinks/occasion for females) and that the risk for alcohol disorders and other health effects rises proportionally with the number of drinks per week and how often a drinker exceeds daily limits.  Discussed  cessation/primary prevention of drug use and availability of treatment for abuse.   Diet: Encouraged to adjust caloric intake to maintain  or achieve ideal body weight, to reduce intake of dietary saturated fat and total fat, to limit sodium intake by avoiding high sodium foods and not adding table salt, and to maintain adequate dietary potassium and calcium preferably from fresh fruits, vegetables, and low-fat dairy products.    stressed the importance of regular exercise  Injury prevention: Discussed safety belts, safety helmets, smoke detector, smoking near bedding or upholstery.   Dental health: Discussed importance of regular tooth brushing, flossing, and dental visits.   Follow up plan: NEXT PREVENTATIVE PHYSICAL DUE IN 1 YEAR. Return Pending results. Marland Kitchen

## 2018-06-07 NOTE — Patient Instructions (Addendum)
Influenza (Flu) Vaccine (Inactivated or Recombinant): What You Need to Know 1. Why get vaccinated? Influenza ("flu") is a contagious disease that spreads around the [place] every year, usually between October and May. Flu is caused by influenza viruses, and is spread mainly by coughing, sneezing, and close contact. Anyone can get flu. Flu strikes suddenly and can last several days. Symptoms vary by age, but can include:  fever/chills  sore throat  muscle aches  fatigue  cough  headache  runny or stuffy nose  Flu can also lead to pneumonia and blood infections, and cause diarrhea and seizures in children. If you have a medical condition, such as heart or lung disease, flu can make it worse. Flu is more dangerous for some people. Infants and young children, people 23 years of age and older, pregnant women, and people with certain health conditions or a weakened immune system are at greatest risk. Each year thousands of people in the [place] die from flu, and many more are hospitalized. Flu vaccine can:  keep you from getting flu,  make flu less severe if you do get it, and  keep you from spreading flu to your family and other people. 2. Inactivated and recombinant flu vaccines A dose of flu vaccine is recommended every flu season. Children 6 months through 91 years of age may need two doses during the same flu season. Everyone else needs only one dose each flu season. Some inactivated flu vaccines contain a very small amount of a mercury-based preservative called thimerosal. Studies have not shown thimerosal in vaccines to be harmful, but flu vaccines that do not contain thimerosal are available. There is no live flu virus in flu shots. They cannot cause the flu. There are many flu viruses, and they are always changing. Each year a new flu vaccine is made to protect against three or four viruses that are likely to cause disease in the upcoming flu season. But even when the  vaccine doesn't exactly match these viruses, it may still provide some protection. Flu vaccine cannot prevent:  flu that is caused by a virus not covered by the vaccine, or  illnesses that look like flu but are not.  It takes about 2 weeks for protection to develop after vaccination, and protection lasts through the flu season. 3. Some people should not get this vaccine Tell the person who is giving you the vaccine:  If you have any severe, life-threatening allergies. If you ever had a life-threatening allergic reaction after a dose of flu vaccine, or have a severe allergy to any part of this vaccine, you may be advised not to get vaccinated. Most, but not all, types of flu vaccine contain a small amount of egg protein.  If you ever had Guillain-Barr Syndrome (also called GBS). Some people with a history of GBS should not get this vaccine. This should be discussed with your doctor.  If you are not feeling well. It is usually okay to get flu vaccine when you have a mild illness, but you might be asked to come back when you feel better.  4. Risks of a vaccine reaction With any medicine, including vaccines, there is a chance of reactions. These are usually mild and go away on their own, but serious reactions are also possible. Most people who get a flu shot do not have any problems with it. Minor problems following a flu shot include:  soreness, redness, or swelling where the shot was given  hoarseness  sore,  red or itchy eyes  cough  fever  aches  headache  itching  fatigue  If these problems occur, they usually begin soon after the shot and last 1 or 2 days. More serious problems following a flu shot can include the following:  There may be a small increased risk of Guillain-Barre Syndrome (GBS) after inactivated flu vaccine. This risk has been estimated at 1 or 2 additional cases per million people vaccinated. This is much lower than the risk of severe complications from  flu, which can be prevented by flu vaccine.  Young children who get the flu shot along with pneumococcal vaccine (PCV13) and/or DTaP vaccine at the same time might be slightly more likely to have a seizure caused by fever. Ask your doctor for more information. Tell your doctor if a child who is getting flu vaccine has ever had a seizure.  Problems that could happen after any injected vaccine:  People sometimes faint after a medical procedure, including vaccination. Sitting or lying down for about 15 minutes can help prevent fainting, and injuries caused by a fall. Tell your doctor if you feel dizzy, or have vision changes or ringing in the ears.  Some people get severe pain in the shoulder and have difficulty moving the arm where a shot was given. This happens very rarely.  Any medication can cause a severe allergic reaction. Such reactions from a vaccine are very rare, estimated at about 1 in a million doses, and would happen within a few minutes to a few hours after the vaccination. As with any medicine, there is a very remote chance of a vaccine causing a serious injury or death. The safety of vaccines is always being monitored. For more information, visit: http://www.aguilar.org/ 5. What if there is a serious reaction? What should I look for? Look for anything that concerns you, such as signs of a severe allergic reaction, very high fever, or unusual behavior. Signs of a severe allergic reaction can include hives, swelling of the face and throat, difficulty breathing, a fast heartbeat, dizziness, and weakness. These would start a few minutes to a few hours after the vaccination. What should I do?  If you think it is a severe allergic reaction or other emergency that can't wait, call 9-1-1 and get the person to the nearest hospital. Otherwise, call your doctor.  Reactions should be reported to the Vaccine Adverse Event Reporting System (VAERS). Your doctor should file this report, or you  can do it yourself through the VAERS web site at www.vaers.SamedayNews.es, or by calling 6094730752. ? VAERS does not give medical advice. 6. The National Vaccine Injury Compensation Program The [employer] Vaccine Injury Compensation Program (VICP) is a federal program that was created to compensate people who may have been injured by certain vaccines. Persons who believe they may have been injured by a vaccine can learn about the program and about filing a claim by calling 458-267-6070 or visiting the [place] website at GoldCloset.com.ee. There is a time limit to file a claim for compensation. 7. How can I learn more?  Ask your healthcare provider. He or she can give you the vaccine package insert or suggest other sources of information.  Call your local or state health department.  Contact the Centers for Disease Control and Prevention (CDC): ? Call (540)164-9661 (1-800-CDC-INFO) or ? Visit CDC's website at https://gibson.com/ Vaccine Information Statement, Inactivated Influenza Vaccine (02/28/2014) This information is not intended to replace advice given to you by your health care provider. Make sure  you discuss any questions you have with your health care provider. Document Released: 05/05/2006 Document Revised: 03/31/2016 Document Reviewed: 03/31/2016 Elsevier Interactive Patient Education  2017 [place] Maintenance, Male A healthy lifestyle and preventive care is important for your health and wellness. Ask your health care provider about what schedule of regular examinations is right for you. What should I know about weight and diet? Eat a Healthy Diet  Eat plenty of vegetables, fruits, whole grains, low-fat dairy products, and lean protein.  Do not eat a lot of foods high in solid fats, added sugars, or salt.  Maintain a Healthy Weight Regular exercise can help you achieve or maintain a healthy weight. You should:  Do at least 150 minutes of exercise each  week. The exercise should increase your heart rate and make you sweat (moderate-intensity exercise).  Do strength-training exercises at least twice a week.  Watch Your Levels of Cholesterol and Blood Lipids  Have your blood tested for lipids and cholesterol every 5 years starting at 44 years of age. If you are at high risk for heart disease, you should start having your blood tested when you are 44 years old. You may need to have your cholesterol levels checked more often if: ? Your lipid or cholesterol levels are high. ? You are older than 44 years of age. ? You are at high risk for heart disease.  What should I know about cancer screening? Many types of cancers can be detected early and may often be prevented. Lung Cancer  You should be screened every year for lung cancer if: ? You are a current smoker who has smoked for at least 30 years. ? You are a former smoker who has quit within the past 15 years.  Talk to your health care provider about your screening options, when you should start screening, and how often you should be screened.  Colorectal Cancer  Routine colorectal cancer screening usually begins at 44 years of age and should be repeated every 5-10 years until you are 44 years old. You may need to be screened more often if early forms of precancerous polyps or small growths are found. Your health care provider may recommend screening at an earlier age if you have risk factors for colon cancer.  Your health care provider may recommend using home test kits to check for hidden blood in the stool.  A small camera at the end of a tube can be used to examine your colon (sigmoidoscopy or colonoscopy). This checks for the earliest forms of colorectal cancer.  Prostate and Testicular Cancer  Depending on your age and overall health, your health care provider may do certain tests to screen for prostate and testicular cancer.  Talk to your health care provider about any symptoms or  concerns you have about testicular or prostate cancer.  Skin Cancer  Check your skin from head to toe regularly.  Tell your health care provider about any new moles or changes in moles, especially if: ? There is a change in a mole's size, shape, or color. ? You have a mole that is larger than a pencil eraser.  Always use sunscreen. Apply sunscreen liberally and repeat throughout the day.  Protect yourself by wearing long sleeves, pants, a wide-brimmed hat, and sunglasses when outside.  What should I know about heart disease, diabetes, and high blood pressure?  If you are 18-44 years of age, have your blood pressure checked every 3-5 years. If you are 40 years  of age or older, have your blood pressure checked every year. You should have your blood pressure measured twice-once when you are at a hospital or clinic, and once when you are not at a hospital or clinic. Record the average of the two measurements. To check your blood pressure when you are not at a hospital or clinic, you can use: ? An automated blood pressure machine at a pharmacy. ? A home blood pressure monitor.  Talk to your health care provider about your target blood pressure.  If you are between 32-66 years old, ask your health care provider if you should take aspirin to prevent heart disease.  Have regular diabetes screenings by checking your fasting blood sugar level. ? If you are at a normal weight and have a low risk for diabetes, have this test once every three years after the age of 68. ? If you are overweight and have a high risk for diabetes, consider being tested at a younger age or more often.  A one-time screening for abdominal aortic aneurysm (AAA) by ultrasound is recommended for men aged 76-75 years who are current or former smokers. What should I know about preventing infection? Hepatitis B If you have a higher risk for hepatitis B, you should be screened for this virus. Talk with your health care provider  to find out if you are at risk for hepatitis B infection. Hepatitis C Blood testing is recommended for:  Everyone born from [birth year] through 1967.  Anyone with known risk factors for hepatitis C.  Sexually Transmitted Diseases (STDs)  You should be screened each year for STDs including gonorrhea and chlamydia if: ? You are sexually active and are younger than 44 years of age. ? You are older than 44 years of age and your health care provider tells you that you are at risk for this type of infection. ? Your sexual activity has changed since you were last screened and you are at an increased risk for chlamydia or gonorrhea. Ask your health care provider if you are at risk.  Talk with your health care provider about whether you are at high risk of being infected with HIV. Your health care provider may recommend a prescription medicine to help prevent HIV infection.  What else can I do?  Schedule regular health, dental, and eye exams.  Stay current with your vaccines (immunizations).  Do not use any tobacco products, such as cigarettes, chewing tobacco, and e-cigarettes. If you need help quitting, ask your health care provider.  Limit alcohol intake to no more than 2 drinks per day. One drink equals 12 ounces of beer, 5 ounces of wine, or 1 ounces of hard liquor.  Do not use street drugs.  Do not share needles.  Ask your health care provider for help if you need support or information about quitting drugs.  Tell your health care provider if you often feel depressed.  Tell your health care provider if you have ever been abused or do not feel safe at home. This information is not intended to replace advice given to you by your health care provider. Make sure you discuss any questions you have with your health care provider. Document Released: 01/07/2008 Document Revised: 03/09/2016 Document Reviewed: 04/14/2015 Elsevier Interactive Patient Education  2018 [place].  Anterior Ankle  Impingement Rehab Ask your health care provider which exercises are safe for you. Do exercises exactly as told by your health care provider and adjust them as directed. It is normal to  feel mild stretching, pulling, tightness, or discomfort as you do these exercises, but you should stop right away if you feel sudden pain or your pain gets worse.Do not begin these exercises until told by your health care provider. Stretching and range of motion exercises These exercises warm up your muscles and joints and improve the movement and flexibility of your ankle. These exercises also help to relieve pain and stiffness. Exercise A: Ankle alphabet  1. Sit with your left / right leg supported at the lower leg. ? Do not rest your foot on anything. ? Make sure your foot has room to move freely. 2. Think of your left / right foot as a paintbrush, and move your foot to trace each letter of the alphabet in the air. Keep your hip and knee still while you trace. Make the letters as large as you can without feeling discomfort. 3. Trace every letter from A to Z. Repeat __________ times. Complete this exercise __________ times per day. Strengthening exercises These exercises build strength and endurance in your ankle. Endurance is the ability to use your muscles for a long time, even after they get tired. Exercise B: Plantar flexors, eccentric  1. Stand on the balls of your feet on the edge of a step. The ball of your foot is on the walking surface, right under your toes. ? Do not put your heels on the step. ? Rest your hand on a railing for balance. 2. If told by your health care provider, put on a backpack to add weight. 3. Rise up onto your toes. 4. Keep your heels up while you slowly shift your body weight to your left / right foot. 5. Pick up your other foot. 6. Slowly lower your weight through your left / right foot so your heel drops below the level of the step. If this causes any pain at the front of your  ankle, stop the motion early. 7. Put your other foot back on the step. Repeat __________ times. Complete this exercise __________ times per day. Exercise C: Evertors 1. Sit on the floor with your legs straight out in front of you. 2. Loop a rubber exercise or band around the ball of your left / right foot. The ball of your foot is on the walking surface, right under your toes. ? Hold the ends of the band in your hands, or secure the band to a stable object. ? The band should be slightly tense when your foot is relaxed. 3. Slowly push your foot outward, away from your other leg. 4. Hold this position for __________ seconds. 5. Slowly return your foot to the starting position. Repeat __________ times. Complete this exercise __________ times per day. Balance exercises These exercises improve or maintain your balance. Balance is important in improving ankle stability and preventing falls. Exercise D: Single leg stand 1. Without shoes, stand near a railing or in a doorway. You may hold onto the railing or door frame if you need to. 2. Stand on your left / right foot. Keep your big toe down on the floor and try to keep your arch lifted. 3. Hold this position for ____ seconds. Repeat __________ times. Complete this exercise __________ times per day. If this exercise is too easy, you can try it with your eyes closed or while standing on a pillow. Exercise E: Inversion/eversion 1  You will need a balance board for this exercise. Ask your health care provider where you can get a balance board or  how you can make one. 1. Stand on a non-carpeted surface near a countertop or wall. 2. Step onto the balance board so your feet are hip-width apart. 3. Keep your feet in place and keep your upper body and hips steady. Using only your feet and ankles, tip the board from side to side as far as you can, alternating between tipping to the left and to the right. If you can, tip the board so it silently taps the  floor. Do not let the board forcefully hit the floor. Repeat __________ times, pausing from time to time to hold a steady position. Complete this exercise __________ times a day. Exercise F: Inversion/eversion 2 You will need a balance board for this exercise. Ask your health care provider where you can get a balance board or how you can make one. 1. Stand on a non-carpeted surface near a countertop or wall. 2. Step onto the balance board so your feet are hip-width apart. 3. Keep your feet in place and keep your upper body and hips steady. Using only your feet and ankles, tip the board from side to side, alternating between tipping to the left and to the right. Do not let the board hit the floor at all. Repeat __________ times, pausing from time to time to hold a steady position. Complete this exercise __________ times a day. Exercise G: Plantar flexion/dorsiflexion 1  You will need a balance board for this exercise. Ask your health care provider where you can get a balance board or how you can make one. 1. Stand on a non-carpeted surface near a countertop or wall. 2. Step onto the balance board so your feet are hip-width apart. 3. Keep your feet in place and keep your upper body and hips steady. Using only your feet and ankles, tip the board forward and backward so the board silently taps the floor. Do not let the board forcefully hit the floor. Repeat __________ times, pausing from time to time to hold a steady position. Complete this exercise __________ times a day. Exercise H: Plantar flexion/dorsiflexion 2 You will need a balance board for this exercise. Ask your health care provider where you can get a balance board or how you can make one. 1. Stand on a non-carpeted surface near a countertop or wall. 2. Step onto the balance board so your feet are hip-width apart. 3. Keep your feet in place and keep your upper body and hips steady. Using only your feet and ankles, tip the board forward and  backward. Do not let the board hit the floor at all. Repeat __________ times, pausing from time to time to hold a steady position. Complete this exercise __________ times a day. This information is not intended to replace advice given to you by your health care provider. Make sure you discuss any questions you have with your health care provider. Document Released: 02/09/2005 Document Revised: 03/17/2016 Document Reviewed: 05/29/2015 Elsevier Interactive Patient Education  [employer].

## 2018-06-07 NOTE — Assessment & Plan Note (Signed)
BP under good control today off medicine. Microalbumin normal. Continue to monitor. Call with any concerns.

## 2018-06-07 NOTE — Assessment & Plan Note (Signed)
Has been off medicine. Await results and treat as needed. Call with any concerns.

## 2018-06-08 ENCOUNTER — Other Ambulatory Visit: Payer: Self-pay | Admitting: Family Medicine

## 2018-06-08 LAB — COMPREHENSIVE METABOLIC PANEL
ALK PHOS: 68 IU/L (ref 39–117)
ALT: 24 IU/L (ref 0–44)
AST: 19 IU/L (ref 0–40)
Albumin/Globulin Ratio: 1.9 (ref 1.2–2.2)
Albumin: 4.2 g/dL (ref 3.5–5.5)
BUN/Creatinine Ratio: 18 (ref 9–20)
BUN: 16 mg/dL (ref 6–24)
Bilirubin Total: 0.6 mg/dL (ref 0.0–1.2)
CALCIUM: 8.9 mg/dL (ref 8.7–10.2)
CHLORIDE: 102 mmol/L (ref 96–106)
CO2: 23 mmol/L (ref 20–29)
Creatinine, Ser: 0.88 mg/dL (ref 0.76–1.27)
GFR calc non Af Amer: 104 mL/min/{1.73_m2} (ref >59)
GFR, EST AFRICAN AMERICAN: 121 mL/min/{1.73_m2} (ref >59)
GLOBULIN, TOTAL: 2.2 g/dL (ref 1.5–4.5)
GLUCOSE: 124 mg/dL — AB (ref 65–99)
POTASSIUM: 3.8 mmol/L (ref 3.5–5.2)
Sodium: 138 mmol/L (ref 134–144)
Total Protein: 6.4 g/dL (ref 6.0–8.5)

## 2018-06-08 LAB — CBC WITH DIFFERENTIAL/PLATELET
BASOS ABS: 0 10*3/uL (ref 0.0–0.2)
Basos: 1 %
EOS (ABSOLUTE): 0.1 10*3/uL (ref 0.0–0.4)
EOS: 1 %
HEMATOCRIT: 43.1 % (ref 37.5–51.0)
Hemoglobin: 15.4 g/dL (ref 13.0–17.7)
Immature Grans (Abs): 0 10*3/uL (ref 0.0–0.1)
Immature Granulocytes: 0 %
LYMPHS ABS: 1.6 10*3/uL (ref 0.7–3.1)
Lymphs: 30 %
MCH: 31.6 pg (ref 26.6–33.0)
MCHC: 35.7 g/dL (ref 31.5–35.7)
MCV: 88 fL (ref 79–97)
MONOCYTES: 6 %
MONOS ABS: 0.3 10*3/uL (ref 0.1–0.9)
Neutrophils Absolute: 3.3 10*3/uL (ref 1.4–7.0)
Neutrophils: 62 %
Platelets: 146 10*3/uL — ABNORMAL LOW (ref 150–450)
RBC: 4.88 x10E6/uL (ref 4.14–5.80)
RDW: 13.7 % (ref 12.3–15.4)
WBC: 5.4 10*3/uL (ref 3.4–10.8)

## 2018-06-08 LAB — LIPID PANEL W/O CHOL/HDL RATIO
CHOLESTEROL TOTAL: 215 mg/dL — AB (ref 100–199)
HDL: 14 mg/dL — ABNORMAL LOW (ref >39)
Triglycerides: 1754 mg/dL (ref 0–149)

## 2018-06-08 LAB — TSH: TSH: 2.36 u[IU]/mL (ref 0.450–4.500)

## 2018-06-08 MED ORDER — ATORVASTATIN CALCIUM 40 MG PO TABS: 40.0000 mg | ORAL_TABLET | Freq: Every day | ORAL | 0 refills | Status: DC

## 2018-08-06 ENCOUNTER — Ambulatory Visit: Payer: BC Managed Care – PPO | Admitting: Family Medicine

## 2018-08-13 ENCOUNTER — Ambulatory Visit: Payer: BC Managed Care – PPO | Admitting: Family Medicine

## 2018-08-13 ENCOUNTER — Encounter: Payer: Self-pay | Admitting: Family Medicine

## 2018-08-13 VITALS — BP 117/77 | HR 67 | Temp 98.0°F | Wt 195.0 lb

## 2018-08-13 DIAGNOSIS — E782 Mixed hyperlipidemia: Secondary | ICD-10-CM | POA: Diagnosis not present

## 2018-08-13 MED ORDER — ATORVASTATIN CALCIUM 40 MG PO TABS: 40.0000 mg | ORAL_TABLET | Freq: Every day | ORAL | 1 refills | Status: DC

## 2018-08-13 NOTE — Assessment & Plan Note (Signed)
Tolerating medicine well. Checking labs today. Call with any concerns. Refills given today.

## 2018-08-13 NOTE — Progress Notes (Signed)
BP 117/77   Pulse 67   Temp 98 F (36.7 C) (Oral)   Wt 195 lb (88.5 kg)   SpO2 98%   BMI 32.15 kg/m    Subjective:    Patient ID: Angel Montes, male    DOB: Sep 23, 1975, 43 y.o.   MRN: [id number]  HPI: Makoa Satz is a 43 y.o. male  Chief Complaint  Patient presents with  . Hyperlipidemia    2 month f/up    HYPERLIPIDEMIA Hyperlipidemia status: excellent compliance Satisfied with current treatment?  no Side effects:  no Medication compliance: excellent compliance Past cholesterol meds: atorvastatin Supplements: none Aspirin:  no The ASCVD Risk score Mikey Bussing DC Jr., et al., 2013) failed to calculate for the following reasons:   The valid HDL cholesterol range is 20 to 100 mg/dL Chest pain:  no Coronary artery disease:  no  Relevant past medical, surgical, family and social history reviewed and updated as indicated. Interim medical history since our last visit reviewed. Allergies and medications reviewed and updated.  Review of Systems  Constitutional: Negative.   Respiratory: Negative.   Cardiovascular: Negative.   Psychiatric/Behavioral: Negative.     Per HPI unless specifically indicated above     Objective:    BP 117/77   Pulse 67   Temp 98 F (36.7 C) (Oral)   Wt 195 lb (88.5 kg)   SpO2 98%   BMI 32.15 kg/m   Wt Readings from Last 3 Encounters:  08/13/18 195 lb (88.5 kg)  06/07/18 190 lb 9.6 oz (86.5 kg)  02/13/17 191 lb (86.6 kg)    Physical Exam Vitals signs and nursing note reviewed.  Constitutional:      General: He is not in acute distress.    Appearance: Normal appearance. He is not ill-appearing, toxic-appearing or diaphoretic.  HENT:     Head: Normocephalic and atraumatic.     Right Ear: External ear normal.     Left Ear: External ear normal.     Nose: Nose normal.     Mouth/Throat:     Mouth: Mucous membranes are moist.     Pharynx: Oropharynx is clear.  Eyes:     General: No scleral icterus.       Right eye: No discharge.         Left eye: No discharge.     Extraocular Movements: Extraocular movements intact.     Conjunctiva/sclera: Conjunctivae normal.     Pupils: Pupils are equal, round, and reactive to light.  Neck:     Musculoskeletal: Normal range of motion and neck supple.  Cardiovascular:     Rate and Rhythm: Normal rate and regular rhythm.     Pulses: Normal pulses.     Heart sounds: Normal heart sounds. No murmur. No friction rub. No gallop.   Pulmonary:     Effort: Pulmonary effort is normal. No respiratory distress.     Breath sounds: Normal breath sounds. No stridor. No wheezing, rhonchi or rales.  Chest:     Chest wall: No tenderness.  Musculoskeletal: Normal range of motion.  Skin:    General: Skin is warm and dry.     Capillary Refill: Capillary refill takes less than 2 seconds.     Coloration: Skin is not jaundiced or pale.     Findings: No bruising, erythema, lesion or rash.  Neurological:     General: No focal deficit present.     Mental Status: He is alert and oriented to person, place, and time. Mental status  is at baseline.  Psychiatric:        Mood and Affect: Mood normal.        Behavior: Behavior normal.        Thought Content: Thought content normal.        Judgment: Judgment normal.     Results for orders placed or performed in visit on 06/07/18  CBC with Differential/Platelet  Result Value Ref Range   WBC 5.4 3.4 - 10.8 x10E3/uL   RBC 4.88 4.14 - 5.80 x10E6/uL   Hemoglobin 15.4 13.0 - 17.7 g/dL   Hematocrit 43.1 37.5 - 51.0 %   MCV 88 79 - 97 fL   MCH 31.6 26.6 - 33.0 pg   MCHC 35.7 31.5 - 35.7 g/dL   RDW 13.7 12.3 - 15.4 %   Platelets 146 (L) 150 - 450 x10E3/uL   Neutrophils 62 Not Estab. %   Lymphs 30 Not Estab. %   Monocytes 6 Not Estab. %   Eos 1 Not Estab. %   Basos 1 Not Estab. %   Neutrophils Absolute 3.3 1.4 - 7.0 x10E3/uL   Lymphocytes Absolute 1.6 0.7 - 3.1 x10E3/uL   Monocytes Absolute 0.3 0.1 - 0.9 x10E3/uL   EOS (ABSOLUTE) 0.1 0.0 - 0.4 x10E3/uL    Basophils Absolute 0.0 0.0 - 0.2 x10E3/uL   Immature Granulocytes 0 Not Estab. %   Immature Grans (Abs) 0.0 0.0 - 0.1 x10E3/uL  Comprehensive metabolic panel  Result Value Ref Range   Glucose 124 (H) 65 - 99 mg/dL   BUN 16 6 - 24 mg/dL   Creatinine, Ser 0.88 0.76 - 1.27 mg/dL   GFR calc non Af Amer 104 >59 mL/min/1.73   GFR calc Af Amer 121 >59 mL/min/1.73   BUN/Creatinine Ratio 18 9 - 20   Sodium 138 134 - 144 mmol/L   Potassium 3.8 3.5 - 5.2 mmol/L   Chloride 102 96 - 106 mmol/L   CO2 23 20 - 29 mmol/L   Calcium 8.9 8.7 - 10.2 mg/dL   Total Protein 6.4 6.0 - 8.5 g/dL   Albumin 4.2 3.5 - 5.5 g/dL   Globulin, Total 2.2 1.5 - 4.5 g/dL   Albumin/Globulin Ratio 1.9 1.2 - 2.2   Bilirubin Total 0.6 0.0 - 1.2 mg/dL   Alkaline Phosphatase 68 39 - 117 IU/L   AST 19 0 - 40 IU/L   ALT 24 0 - 44 IU/L  Lipid Panel w/o Chol/HDL Ratio  Result Value Ref Range   Cholesterol, Total 215 (H) 100 - 199 mg/dL   Triglycerides 1,754 (HH) 0 - 149 mg/dL   HDL 14 (L) >39 mg/dL   VLDL Cholesterol Cal Comment 5 - 40 mg/dL   LDL Calculated Comment 0 - 99 mg/dL  Microalbumin, Urine Waived  Result Value Ref Range   Microalb, Ur Waived 30 (H) 0 - 19 mg/L   Creatinine, Urine Waived 200 10 - 300 mg/dL   Microalb/Creat Ratio <30 <30 mg/g  TSH  Result Value Ref Range   TSH 2.360 0.450 - 4.500 uIU/mL  UA/M w/rflx Culture, Routine  Result Value Ref Range   Specific Gravity, UA 1.020 1.005 - 1.030   pH, UA 7.0 5.0 - 7.5   Color, UA Yellow Yellow   Appearance Ur Hazy (A) Clear   Leukocytes, UA Negative Negative   Protein, UA Negative Negative/Trace   Glucose, UA Negative Negative   Ketones, UA Negative Negative   RBC, UA Negative Negative   Bilirubin, UA Negative Negative  Urobilinogen, Ur 1.0 0.2 - 1.0 mg/dL   Nitrite, UA Negative Negative      Assessment & Plan:   Problem List Items Addressed This Visit      Other   Hyperlipidemia - Primary    Tolerating medicine well. Checking labs today.  Call with any concerns. Refills given today.      Relevant Medications   atorvastatin (LIPITOR) 40 MG tablet   Other Relevant Orders   Comprehensive metabolic panel   Lipid Panel w/o Chol/HDL Ratio       Follow up plan: Return in about 6 months (around 02/11/2019) for 6 month follow up.

## 2018-08-14 LAB — COMPREHENSIVE METABOLIC PANEL
ALBUMIN: 4.9 g/dL (ref 4.0–5.0)
ALK PHOS: 79 IU/L (ref 39–117)
ALT: 37 IU/L (ref 0–44)
AST: 26 IU/L (ref 0–40)
Albumin/Globulin Ratio: 2 (ref 1.2–2.2)
BILIRUBIN TOTAL: 1 mg/dL (ref 0.0–1.2)
BUN/Creatinine Ratio: 15 (ref 9–20)
BUN: 16 mg/dL (ref 6–24)
CHLORIDE: 102 mmol/L (ref 96–106)
CO2: 24 mmol/L (ref 20–29)
Calcium: 9.4 mg/dL (ref 8.7–10.2)
Creatinine, Ser: 1.04 mg/dL (ref 0.76–1.27)
GFR calc Af Amer: 100 mL/min/{1.73_m2} (ref >59)
GFR calc non Af Amer: 87 mL/min/{1.73_m2} (ref >59)
Globulin, Total: 2.5 g/dL (ref 1.5–4.5)
Glucose: 83 mg/dL (ref 65–99)
Potassium: 4 mmol/L (ref 3.5–5.2)
Sodium: 143 mmol/L (ref 134–144)
Total Protein: 7.4 g/dL (ref 6.0–8.5)

## 2018-08-14 LAB — LIPID PANEL W/O CHOL/HDL RATIO
CHOLESTEROL TOTAL: 116 mg/dL (ref 100–199)
HDL: 28 mg/dL — AB (ref >39)
LDL Calculated: 12 mg/dL (ref 0–99)
Triglycerides: 379 mg/dL — ABNORMAL HIGH (ref 0–149)
VLDL CHOLESTEROL CAL: 76 mg/dL — AB (ref 5–40)

## 2018-09-03 ENCOUNTER — Telehealth: Payer: Self-pay | Admitting: Family Medicine

## 2018-09-03 NOTE — Telephone Encounter (Signed)
Copied from [place] [id number]. Topic: Quick Communication - See Telephone Encounter >> Sep 03, 2018  5:10 PM Blase Mess A wrote: CRM for notification. See Telephone encounter for: 09/03/18.  Lexine Baton calling from [employer] is requesting medical records for the patient CB [id number]

## 2018-09-04 NOTE — Telephone Encounter (Signed)
LVM for office to call back to see what's needed.

## 2018-09-06 NOTE — Telephone Encounter (Signed)
Faxed same day. Delayed closing encounter.

## 2018-10-01 ENCOUNTER — Ambulatory Visit: Payer: BC Managed Care – PPO | Admitting: Family Medicine

## 2018-10-01 ENCOUNTER — Encounter: Payer: Self-pay | Admitting: Family Medicine

## 2018-10-01 VITALS — BP 149/99 | HR 85 | Temp 98.9°F | Wt 189.6 lb

## 2018-10-01 DIAGNOSIS — R197 Diarrhea, unspecified: Secondary | ICD-10-CM

## 2018-10-01 DIAGNOSIS — R52 Pain, unspecified: Secondary | ICD-10-CM

## 2018-10-01 LAB — VERITOR FLU A/B WAIVED
Influenza A: NEGATIVE
Influenza B: NEGATIVE

## 2018-10-01 MED ORDER — CIPROFLOXACIN HCL 500 MG PO TABS: 500.0000 mg | ORAL_TABLET | Freq: Two times a day (BID) | ORAL | 0 refills | Status: DC

## 2018-10-01 NOTE — Progress Notes (Signed)
BP (!) 149/99   Pulse 85   Temp 98.9 F (37.2 C) (Oral)   Wt 189 lb 9.6 oz (86 kg)   SpO2 99%   BMI 31.26 kg/m    Subjective:    Patient ID: Angel Montes, male    DOB: 1976-04-18, 44 y.o.   MRN: [id number]  HPI: Angel Montes is a 43 y.o. male  Chief Complaint  Patient presents with  . Generalized Body Aches    pt states he has been having fatigue, body aches, and diarrhea since Friday. States he was in [place] from Monday through Thursday   UPPER RESPIRATORY TRACT INFECTION Duration: 4 days Worst symptom: cold chills Fever: yes- low grade Cough: no Shortness of breath: no Wheezing: no Chest pain: no Chest tightness: no Chest congestion: no Nasal congestion: no Runny nose: no Post nasal drip: no Sneezing: no Sore throat: no Swollen glands: no Sinus pressure: no Headache: no Face pain: no Toothache: no Ear pain: no  Ear pressure: no  Eyes red/itching:no Eye drainage/crusting: no  Vomiting: yes  Diarrhea: yes- 5-6x a day Rash: no Fatigue: yes Sick contacts: no Strep contacts: no  Context: worse Recurrent sinusitis: no Relief with OTC cold/cough medications: no  Treatments attempted: imodium  Relevant past medical, surgical, family and social history reviewed and updated as indicated. Interim medical history since our last visit reviewed. Allergies and medications reviewed and updated.  Review of Systems  Constitutional: Positive for fatigue and fever. Negative for activity change, appetite change, chills, diaphoresis and unexpected weight change.  HENT: Negative.   Respiratory: Negative.   Cardiovascular: Negative.   Gastrointestinal: Positive for diarrhea. Negative for abdominal distention, abdominal pain, anal bleeding, blood in stool, constipation, nausea, rectal pain and vomiting.  Musculoskeletal: Negative.   Skin: Negative.   Psychiatric/Behavioral: Negative.     Per HPI unless specifically indicated above     Objective:    BP (!)  149/99   Pulse 85   Temp 98.9 F (37.2 C) (Oral)   Wt 189 lb 9.6 oz (86 kg)   SpO2 99%   BMI 31.26 kg/m   Wt Readings from Last 3 Encounters:  10/01/18 189 lb 9.6 oz (86 kg)  08/13/18 195 lb (88.5 kg)  06/07/18 190 lb 9.6 oz (86.5 kg)    Physical Exam Vitals signs and nursing note reviewed.  Constitutional:      General: He is not in acute distress.    Appearance: Normal appearance. He is not ill-appearing, toxic-appearing or diaphoretic.  HENT:     Head: Normocephalic and atraumatic.     Right Ear: Tympanic membrane, ear canal and external ear normal. There is no impacted cerumen.     Left Ear: Tympanic membrane, ear canal and external ear normal. There is no impacted cerumen.     Nose: Nose normal. No congestion or rhinorrhea.     Mouth/Throat:     Mouth: Mucous membranes are moist.     Pharynx: Oropharynx is clear. No oropharyngeal exudate or posterior oropharyngeal erythema.  Eyes:     General: No scleral icterus.       Right eye: No discharge.        Left eye: No discharge.     Extraocular Movements: Extraocular movements intact.     Conjunctiva/sclera: Conjunctivae normal.     Pupils: Pupils are equal, round, and reactive to light.  Neck:     Musculoskeletal: Normal range of motion and neck supple. No neck rigidity or muscular tenderness.  Vascular: No carotid bruit.  Cardiovascular:     Rate and Rhythm: Normal rate and regular rhythm.     Pulses: Normal pulses.     Heart sounds: Normal heart sounds. No murmur. No friction rub. No gallop.   Pulmonary:     Effort: Pulmonary effort is normal. No respiratory distress.     Breath sounds: Normal breath sounds. No stridor. No wheezing, rhonchi or rales.  Chest:     Chest wall: No tenderness.  Abdominal:     General: Abdomen is flat. Bowel sounds are increased. There is no distension or abdominal bruit. There are no signs of injury.     Palpations: Abdomen is soft. There is no shifting dullness, fluid wave,  hepatomegaly, splenomegaly, mass or pulsatile mass.     Tenderness: There is no abdominal tenderness.     Hernia: No hernia is present.  Musculoskeletal: Normal range of motion.  Lymphadenopathy:     Cervical: No cervical adenopathy.  Skin:    General: Skin is warm and dry.     Capillary Refill: Capillary refill takes less than 2 seconds.     Coloration: Skin is not jaundiced or pale.     Findings: No bruising, erythema, lesion or rash.  Neurological:     General: No focal deficit present.     Mental Status: He is alert and oriented to person, place, and time. Mental status is at baseline.     Cranial Nerves: No cranial nerve deficit.     Sensory: No sensory deficit.     Motor: No weakness.     Coordination: Coordination normal.     Gait: Gait normal.     Deep Tendon Reflexes: Reflexes normal.  Psychiatric:        Mood and Affect: Mood normal.        Behavior: Behavior normal.        Thought Content: Thought content normal.        Judgment: Judgment normal.     Results for orders placed or performed in visit on 08/13/18  Comprehensive metabolic panel  Result Value Ref Range   Glucose 83 65 - 99 mg/dL   BUN 16 6 - 24 mg/dL   Creatinine, Ser 1.04 0.76 - 1.27 mg/dL   GFR calc non Af Amer 87 >59 mL/min/1.73   GFR calc Af Amer 100 >59 mL/min/1.73   BUN/Creatinine Ratio 15 9 - 20   Sodium 143 134 - 144 mmol/L   Potassium 4.0 3.5 - 5.2 mmol/L   Chloride 102 96 - 106 mmol/L   CO2 24 20 - 29 mmol/L   Calcium 9.4 8.7 - 10.2 mg/dL   Total Protein 7.4 6.0 - 8.5 g/dL   Albumin 4.9 4.0 - 5.0 g/dL   Globulin, Total 2.5 1.5 - 4.5 g/dL   Albumin/Globulin Ratio 2.0 1.2 - 2.2   Bilirubin Total 1.0 0.0 - 1.2 mg/dL   Alkaline Phosphatase 79 39 - 117 IU/L   AST 26 0 - 40 IU/L   ALT 37 0 - 44 IU/L  Lipid Panel w/o Chol/HDL Ratio  Result Value Ref Range   Cholesterol, Total 116 100 - 199 mg/dL   Triglycerides 379 (H) 0 - 149 mg/dL   HDL 28 (L) >39 mg/dL   VLDL Cholesterol Cal 76 (H) 5  - 40 mg/dL   LDL Calculated 12 0 - 99 mg/dL      Assessment & Plan:   Problem List Items Addressed This Visit    None  Visit Diagnoses    Diarrhea of presumed infectious origin    -  Primary   Will check stool studies and treat empirically with cipro. Call if not getting better or getting worse.    Relevant Orders   Ova and parasite examination   Stool C-Diff Toxin Assay   Fecal leukocytes   Stool Culture   Body aches       Negative flu. Call with any concerns.    Relevant Orders   Veritor Flu A/B Waived       Follow up plan: Return if symptoms worsen or fail to improve.

## 2018-10-02 ENCOUNTER — Other Ambulatory Visit: Payer: Self-pay | Admitting: Family Medicine

## 2018-10-02 ENCOUNTER — Other Ambulatory Visit: Payer: BC Managed Care – PPO

## 2018-10-02 DIAGNOSIS — R197 Diarrhea, unspecified: Secondary | ICD-10-CM

## 2018-10-04 LAB — CLOSTRIDIUM DIFFICILE EIA: C difficile Toxins A+B, EIA: NEGATIVE

## 2018-10-05 ENCOUNTER — Encounter: Payer: Self-pay | Admitting: Family Medicine

## 2018-10-05 LAB — OVA AND PARASITE EXAMINATION

## 2018-10-11 LAB — STOOL CULTURE: E coli, Shiga toxin Assay: NEGATIVE

## 2018-10-11 LAB — FECAL LEUKOCYTES

## 2019-01-01 ENCOUNTER — Encounter: Payer: Self-pay | Admitting: Family Medicine

## 2019-01-01 MED ORDER — ATORVASTATIN CALCIUM 40 MG PO TABS: 40.0000 mg | ORAL_TABLET | Freq: Every day | ORAL | 1 refills | Status: DC

## 2019-01-08 IMAGING — DX DG FOOT COMPLETE 3+V*R*
4 series · 4 of 4 positions shown · non-contrast
Comparison: None.

CLINICAL DATA: Right heel pain x 10 days after jumping from 4- 5 ft
high, no old injury, no surgery, more painful weight bearing and
pain is sharp, shielded

EXAM:
RIGHT FOOT COMPLETE - 3+ VIEW

[foot ap]
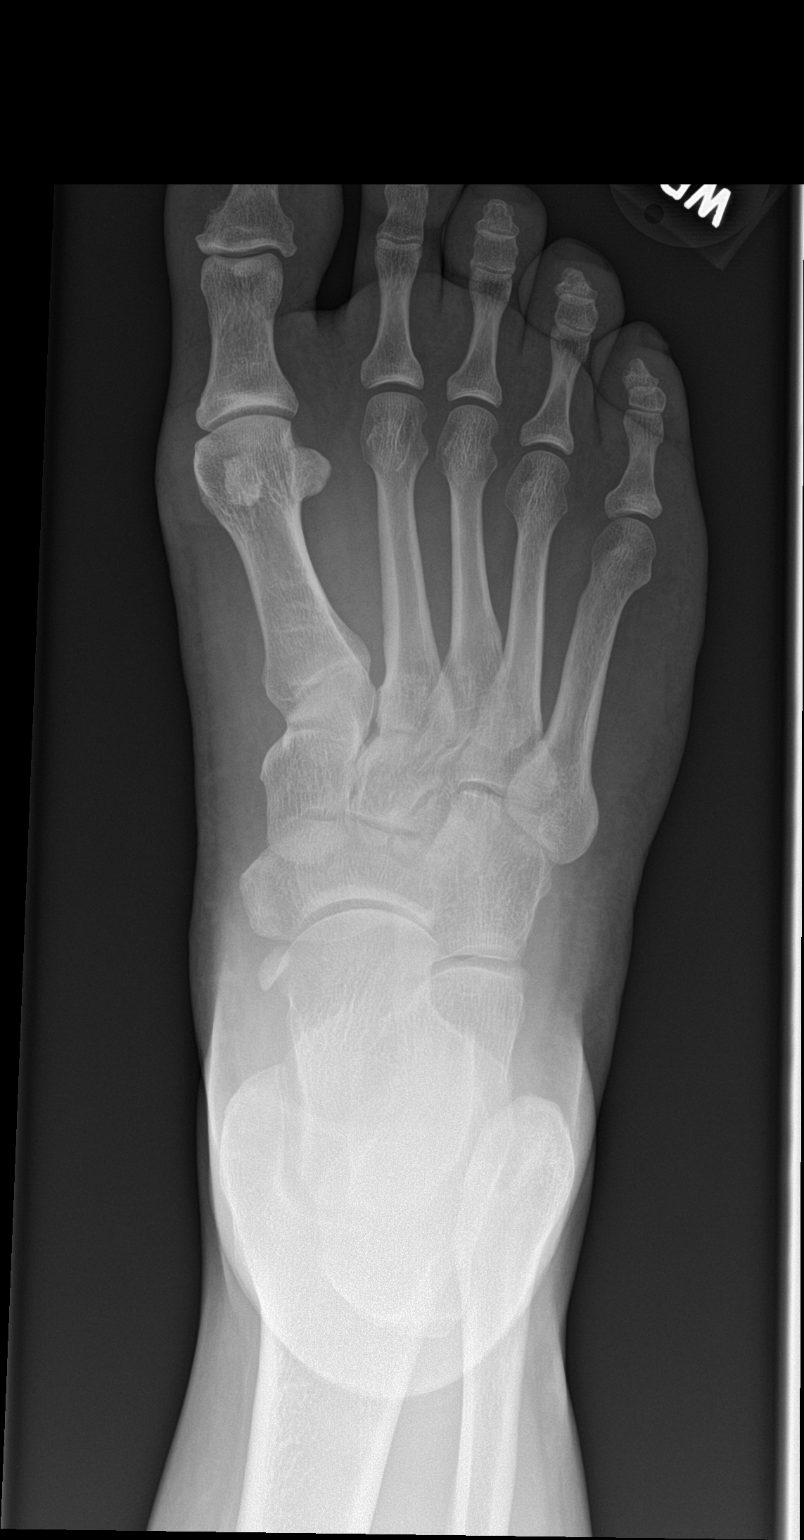

[foot obl]
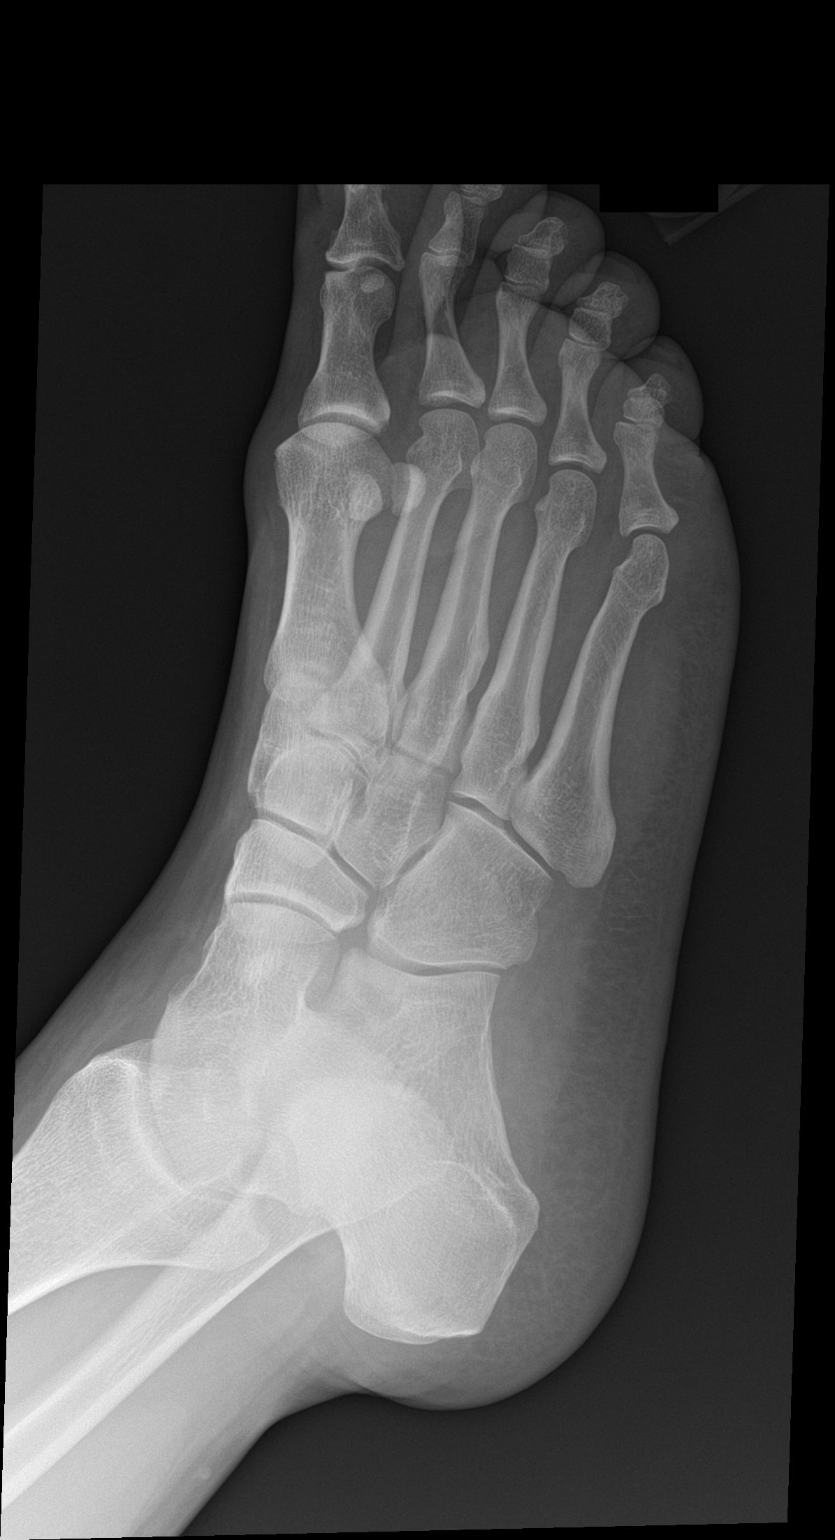

[foot lat (1 of 2)]
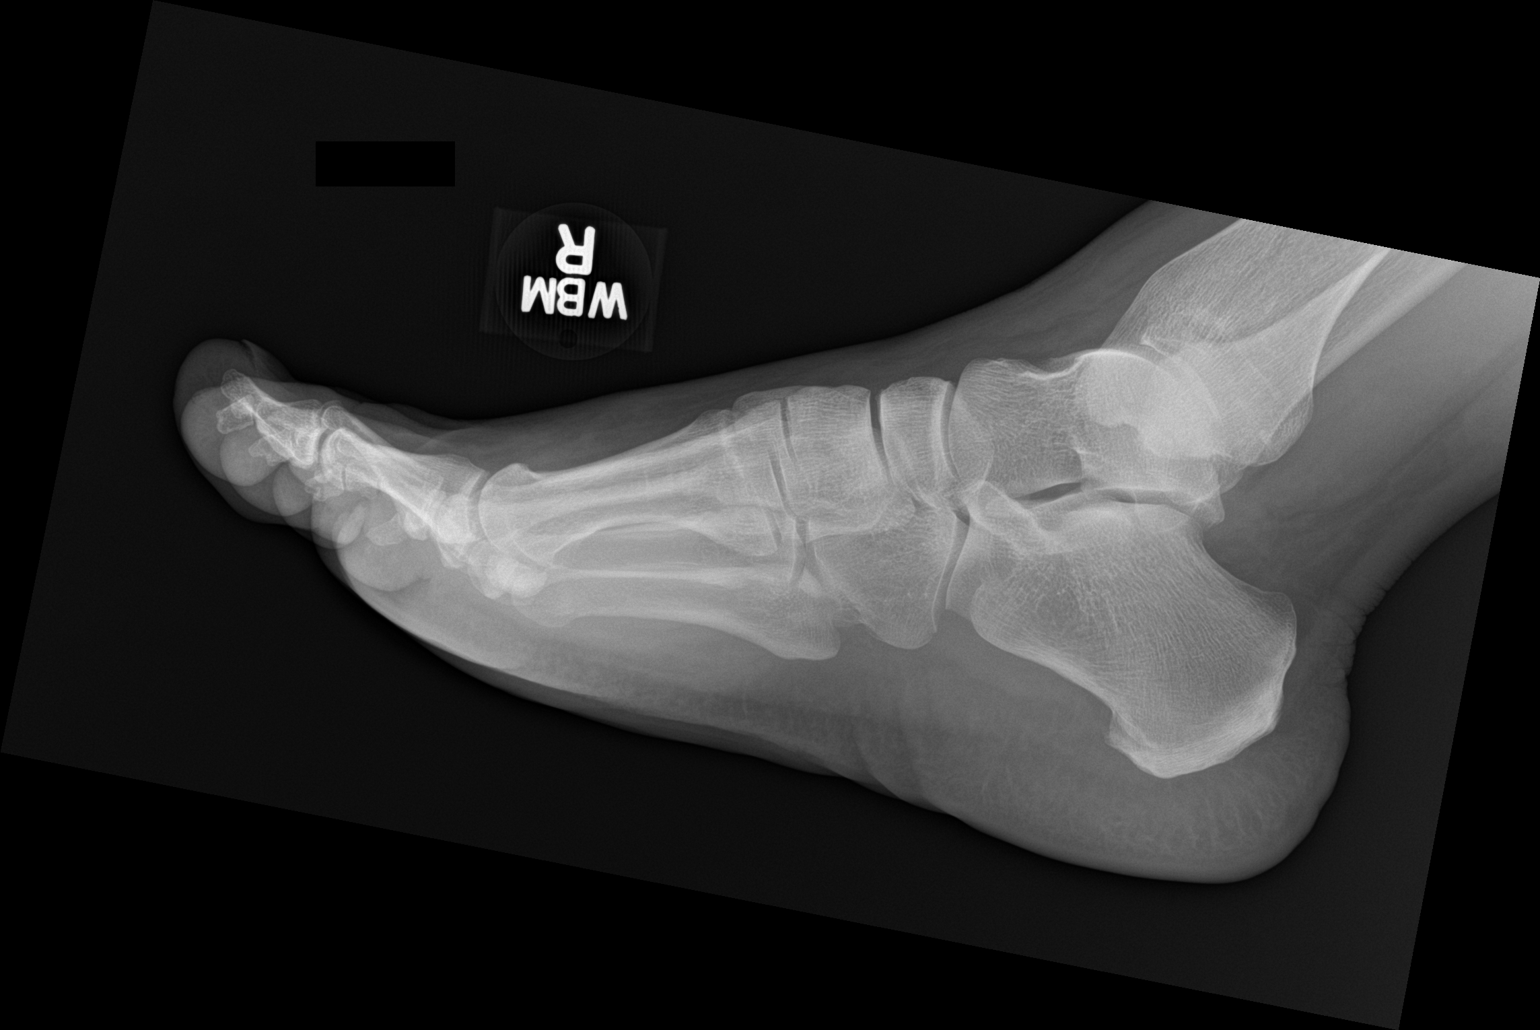

[foot lat (2 of 2)]
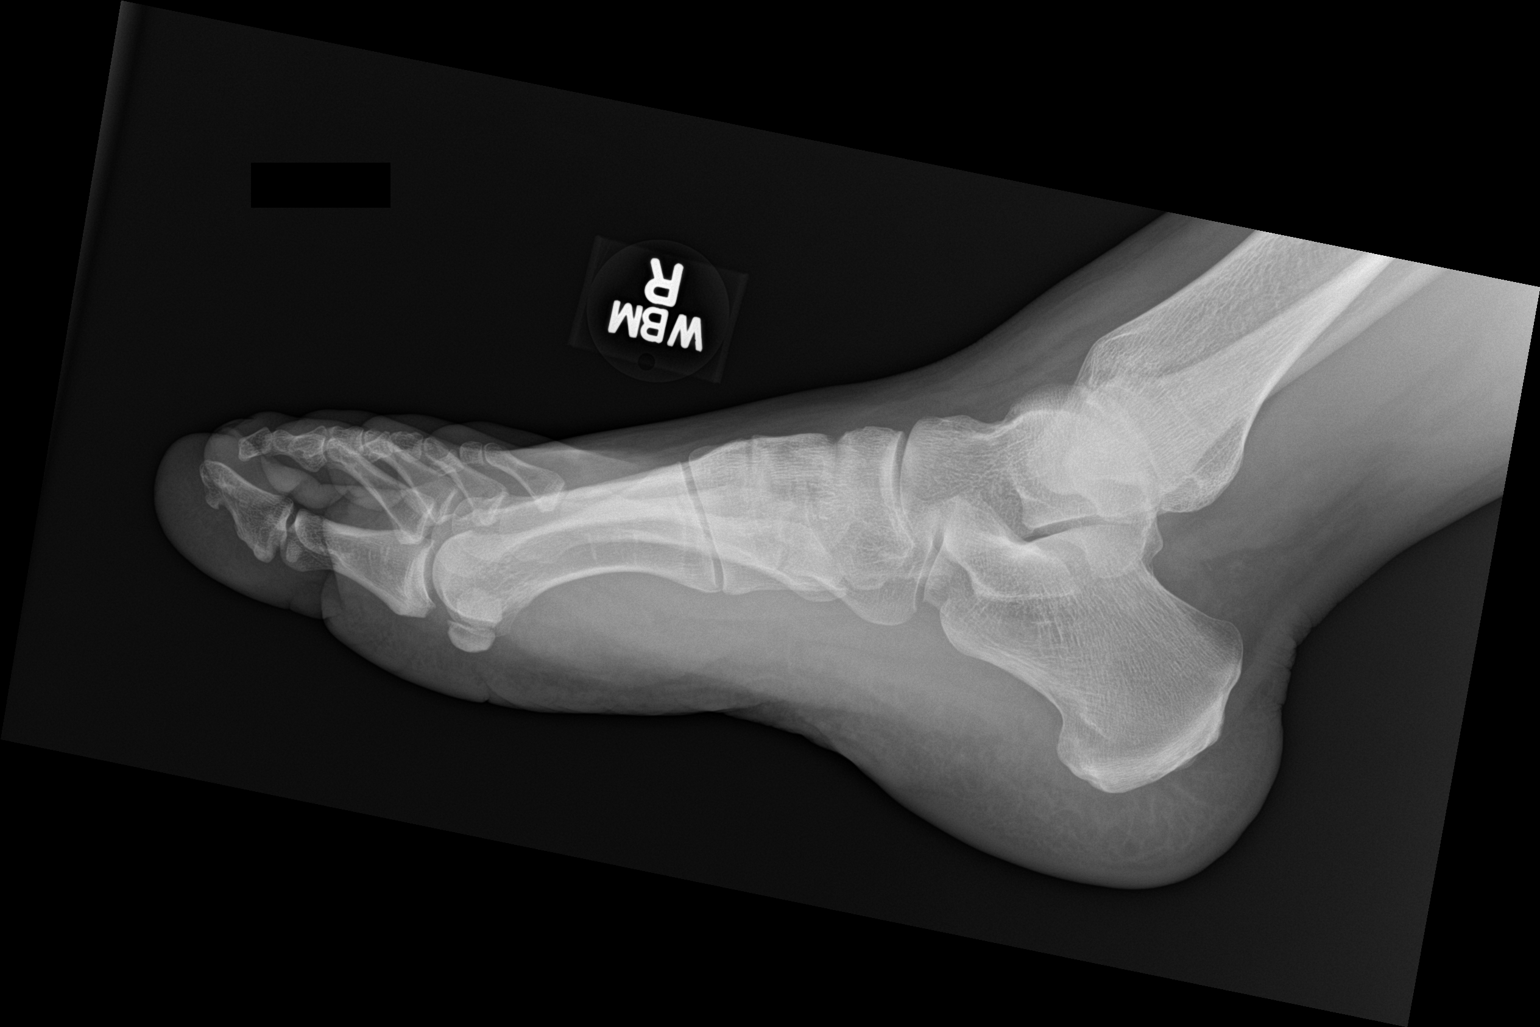

[4 of 4 positions shown; findings below may reference images not displayed]

FINDINGS: There is no evidence of fracture or dislocation. There is no
evidence of arthropathy or other focal bone abnormality. Soft
tissues are unremarkable.
IMPRESSION: Negative.

## 2019-01-28 ENCOUNTER — Ambulatory Visit: Payer: BC Managed Care – PPO | Admitting: Family Medicine

## 2019-02-07 ENCOUNTER — Ambulatory Visit: Payer: BC Managed Care – PPO | Admitting: Family Medicine

## 2019-02-07 ENCOUNTER — Other Ambulatory Visit: Payer: Self-pay

## 2019-02-07 ENCOUNTER — Encounter: Payer: Self-pay | Admitting: Family Medicine

## 2019-02-07 VITALS — BP 125/83 | HR 71 | Temp 98.3°F | Ht 65.0 in | Wt 200.0 lb

## 2019-02-07 DIAGNOSIS — E782 Mixed hyperlipidemia: Secondary | ICD-10-CM

## 2019-02-07 DIAGNOSIS — I10 Essential (primary) hypertension: Secondary | ICD-10-CM

## 2019-02-07 MED ORDER — ATORVASTATIN CALCIUM 40 MG PO TABS: 40.0000 mg | ORAL_TABLET | Freq: Every day | ORAL | 1 refills | Status: DC

## 2019-02-07 NOTE — Assessment & Plan Note (Signed)
Under good control on current regimen. Continue current regimen. Continue to monitor. Call with any concerns. Refills given. Labs drawn today.   

## 2019-02-07 NOTE — Progress Notes (Signed)
BP 125/83   Pulse 71   Temp 98.3 F (36.8 C) (Oral)   Ht 5\' 5"  (1.651 m)   Wt 200 lb (90.7 kg)   SpO2 96%   BMI 33.28 kg/m    Subjective:    Patient ID: Angel Montes, male    DOB: 04-Oct-1975, 43 y.o.   MRN: [id number]  HPI: Angel Montes is a 43 y.o. male  Chief Complaint  Patient presents with  . Hyperlipidemia   HYPERTENSION / HYPERLIPIDEMIA Satisfied with current treatment? yes Duration of hypertension: chronic BP monitoring frequency: not checking BP medication side effects: no Past BP meds: none Duration of hyperlipidemia: chronic Cholesterol medication side effects: no Cholesterol supplements: none Past cholesterol medications: atorvastatin Medication compliance: excellent compliance Aspirin: no Recent stressors: no Recurrent headaches: no Visual changes: no Palpitations: no Dyspnea: no Chest pain: no Lower extremity edema: no Dizzy/lightheaded: no  Relevant past medical, surgical, family and social history reviewed and updated as indicated. Interim medical history since our last visit reviewed. Allergies and medications reviewed and updated.  Review of Systems  Constitutional: Negative.   Respiratory: Negative.   Cardiovascular: Negative.   Neurological: Negative.   Psychiatric/Behavioral: Negative.     Per HPI unless specifically indicated above     Objective:    BP 125/83   Pulse 71   Temp 98.3 F (36.8 C) (Oral)   Ht 5\' 5"  (1.651 m)   Wt 200 lb (90.7 kg)   SpO2 96%   BMI 33.28 kg/m   Wt Readings from Last 3 Encounters:  02/07/19 200 lb (90.7 kg)  10/01/18 189 lb 9.6 oz (86 kg)  08/13/18 195 lb (88.5 kg)    Physical Exam Vitals signs and nursing note reviewed.  Constitutional:      General: He is not in acute distress.    Appearance: Normal appearance. He is not ill-appearing, toxic-appearing or diaphoretic.  HENT:     Head: Normocephalic and atraumatic.     Right Ear: External ear normal.     Left Ear: External ear normal.      Nose: Nose normal.     Mouth/Throat:     Mouth: Mucous membranes are moist.     Pharynx: Oropharynx is clear.  Eyes:     General: No scleral icterus.       Right eye: No discharge.        Left eye: No discharge.     Extraocular Movements: Extraocular movements intact.     Conjunctiva/sclera: Conjunctivae normal.     Pupils: Pupils are equal, round, and reactive to light.  Neck:     Musculoskeletal: Normal range of motion and neck supple.  Cardiovascular:     Rate and Rhythm: Normal rate and regular rhythm.     Pulses: Normal pulses.     Heart sounds: Normal heart sounds. No murmur. No friction rub. No gallop.   Pulmonary:     Effort: Pulmonary effort is normal. No respiratory distress.     Breath sounds: Normal breath sounds. No stridor. No wheezing, rhonchi or rales.  Chest:     Chest wall: No tenderness.  Musculoskeletal: Normal range of motion.  Skin:    General: Skin is warm and dry.     Capillary Refill: Capillary refill takes less than 2 seconds.     Coloration: Skin is not jaundiced or pale.     Findings: No bruising, erythema, lesion or rash.  Neurological:     General: No focal deficit present.  Mental Status: He is alert and oriented to person, place, and time. Mental status is at baseline.  Psychiatric:        Mood and Affect: Mood normal.        Behavior: Behavior normal.        Thought Content: Thought content normal.        Judgment: Judgment normal.     Results for orders placed or performed in visit on 10/02/18  Ova and parasite examination   ST  Result Value Ref Range   OVA + PARASITE EXAM Final report    O&P result 1 Comment       Assessment & Plan:   Problem List Items Addressed This Visit      Cardiovascular and Mediastinum   Hypertension - Primary    Well controlled off medicine. Continue to monitor. Call with any concerns.      Relevant Medications   atorvastatin (LIPITOR) 40 MG tablet   Other Relevant Orders   Comprehensive  metabolic panel     Other   Hyperlipidemia    Under good control on current regimen. Continue current regimen. Continue to monitor. Call with any concerns. Refills given. Labs drawn today.       Relevant Medications   atorvastatin (LIPITOR) 40 MG tablet   Other Relevant Orders   Comprehensive metabolic panel   Lipid Panel w/o Chol/HDL Ratio       Follow up plan: Return in about 6 months (around 08/10/2019) for Physical.

## 2019-02-07 NOTE — Assessment & Plan Note (Signed)
Well controlled off medicine. Continue to monitor. Call with any concerns.

## 2019-02-08 ENCOUNTER — Other Ambulatory Visit: Payer: Self-pay | Admitting: Family Medicine

## 2019-02-08 LAB — LIPID PANEL W/O CHOL/HDL RATIO
Cholesterol, Total: 152 mg/dL (ref 100–199)
HDL: 17 mg/dL — ABNORMAL LOW (ref >39)
Triglycerides: 1003 mg/dL (ref 0–149)

## 2019-02-08 LAB — COMPREHENSIVE METABOLIC PANEL
ALT: 56 IU/L — ABNORMAL HIGH (ref 0–44)
AST: 27 IU/L (ref 0–40)
Albumin/Globulin Ratio: 2.1 (ref 1.2–2.2)
Albumin: 4.6 g/dL (ref 4.0–5.0)
Alkaline Phosphatase: 76 IU/L (ref 39–117)
BUN/Creatinine Ratio: 15 (ref 9–20)
BUN: 16 mg/dL (ref 6–24)
Bilirubin Total: 0.6 mg/dL (ref 0.0–1.2)
CO2: 21 mmol/L (ref 20–29)
Calcium: 9.1 mg/dL (ref 8.7–10.2)
Chloride: 103 mmol/L (ref 96–106)
Creatinine, Ser: 1.07 mg/dL (ref 0.76–1.27)
GFR calc Af Amer: 96 mL/min/{1.73_m2} (ref >59)
GFR calc non Af Amer: 83 mL/min/{1.73_m2} (ref >59)
Globulin, Total: 2.2 g/dL (ref 1.5–4.5)
Glucose: 100 mg/dL — ABNORMAL HIGH (ref 65–99)
Potassium: 4.4 mmol/L (ref 3.5–5.2)
Sodium: 141 mmol/L (ref 134–144)
Total Protein: 6.8 g/dL (ref 6.0–8.5)

## 2019-02-08 MED ORDER — ATORVASTATIN CALCIUM 80 MG PO TABS: 80.0000 mg | ORAL_TABLET | Freq: Every day | ORAL | 1 refills | Status: DC

## 2019-03-19 ENCOUNTER — Other Ambulatory Visit: Payer: BC Managed Care – PPO

## 2019-03-20 ENCOUNTER — Other Ambulatory Visit: Payer: Self-pay

## 2019-03-20 ENCOUNTER — Encounter: Payer: Self-pay | Admitting: Family Medicine

## 2019-03-20 ENCOUNTER — Other Ambulatory Visit: Payer: BC Managed Care – PPO

## 2019-03-20 ENCOUNTER — Ambulatory Visit (INDEPENDENT_AMBULATORY_CARE_PROVIDER_SITE_OTHER): Payer: BC Managed Care – PPO | Admitting: Family Medicine

## 2019-03-20 VITALS — BP 142/86 | HR 71 | Temp 99.4°F | Wt 193.1 lb

## 2019-03-20 DIAGNOSIS — Z23 Encounter for immunization: Secondary | ICD-10-CM | POA: Diagnosis not present

## 2019-03-20 DIAGNOSIS — E782 Mixed hyperlipidemia: Secondary | ICD-10-CM | POA: Diagnosis not present

## 2019-03-20 NOTE — Assessment & Plan Note (Signed)
Tolerating his atorvastatin well. No concerns. Continue current regimen. Continue to monitor. Refills given today. Labs drawn today.

## 2019-03-20 NOTE — Progress Notes (Signed)
BP (!) 142/86   Pulse 71   Temp 99.4 F (37.4 C)   Wt 193 lb 2 oz (87.6 kg)   BMI 32.14 kg/m    Subjective:    Patient ID: Angel Montes, male    DOB: Feb 11, 1976, 43 y.o.   MRN: [id number]  HPI: Angel Montes is a 43 y.o. male  Chief Complaint  Patient presents with  . Hyperlipidemia   HYPERLIPIDEMIA Hyperlipidemia status: excellent compliance Satisfied with current treatment?  yes Side effects:  no Medication compliance: excellent compliance Past cholesterol meds: atorvastatin Supplements: none Aspirin:  no The ASCVD Risk score Mikey Bussing DC Jr., et al., 2013) failed to calculate for the following reasons:   The valid HDL cholesterol range is 20 to 100 mg/dL Chest pain:  no Coronary artery disease:  no  Relevant past medical, surgical, family and social history reviewed and updated as indicated. Interim medical history since our last visit reviewed. Allergies and medications reviewed and updated.  Review of Systems  Constitutional: Negative.   Respiratory: Negative.   Cardiovascular: Negative.   Neurological: Negative.   Psychiatric/Behavioral: Negative.     Per HPI unless specifically indicated above     Objective:    BP (!) 142/86   Pulse 71   Temp 99.4 F (37.4 C)   Wt 193 lb 2 oz (87.6 kg)   BMI 32.14 kg/m   Wt Readings from Last 3 Encounters:  03/20/19 193 lb 2 oz (87.6 kg)  02/07/19 200 lb (90.7 kg)  10/01/18 189 lb 9.6 oz (86 kg)    Physical Exam Vitals signs and nursing note reviewed.  Constitutional:      General: He is not in acute distress.    Appearance: Normal appearance. He is not ill-appearing, toxic-appearing or diaphoretic.  HENT:     Head: Normocephalic and atraumatic.     Right Ear: External ear normal.     Left Ear: External ear normal.     Nose: Nose normal.     Mouth/Throat:     Mouth: Mucous membranes are moist.     Pharynx: Oropharynx is clear.  Eyes:     General: No scleral icterus.       Right eye: No discharge.         Left eye: No discharge.     Conjunctiva/sclera: Conjunctivae normal.     Pupils: Pupils are equal, round, and reactive to light.  Neck:     Musculoskeletal: Normal range of motion.  Pulmonary:     Effort: Pulmonary effort is normal. No respiratory distress.     Comments: Speaking in full sentences Musculoskeletal: Normal range of motion.  Skin:    Coloration: Skin is not jaundiced or pale.     Findings: No bruising, erythema, lesion or rash.  Neurological:     Mental Status: He is alert and oriented to person, place, and time. Mental status is at baseline.  Psychiatric:        Mood and Affect: Mood normal.        Behavior: Behavior normal.        Thought Content: Thought content normal.        Judgment: Judgment normal.     Results for orders placed or performed in visit on 02/07/19  Comprehensive metabolic panel  Result Value Ref Range   Glucose 100 (H) 65 - 99 mg/dL   BUN 16 6 - 24 mg/dL   Creatinine, Ser 1.07 0.76 - 1.27 mg/dL   GFR calc non Af Wyvonnia Lora  83 >59 mL/min/1.73   GFR calc Af Amer 96 >59 mL/min/1.73   BUN/Creatinine Ratio 15 9 - 20   Sodium 141 134 - 144 mmol/L   Potassium 4.4 3.5 - 5.2 mmol/L   Chloride 103 96 - 106 mmol/L   CO2 21 20 - 29 mmol/L   Calcium 9.1 8.7 - 10.2 mg/dL   Total Protein 6.8 6.0 - 8.5 g/dL   Albumin 4.6 4.0 - 5.0 g/dL   Globulin, Total 2.2 1.5 - 4.5 g/dL   Albumin/Globulin Ratio 2.1 1.2 - 2.2   Bilirubin Total 0.6 0.0 - 1.2 mg/dL   Alkaline Phosphatase 76 39 - 117 IU/L   AST 27 0 - 40 IU/L   ALT 56 (H) 0 - 44 IU/L  Lipid Panel w/o Chol/HDL Ratio  Result Value Ref Range   Cholesterol, Total 152 100 - 199 mg/dL   Triglycerides 1,003 (HH) 0 - 149 mg/dL   HDL 17 (L) >39 mg/dL   VLDL Cholesterol Cal Comment 5 - 40 mg/dL   LDL Calculated Comment 0 - 99 mg/dL      Assessment & Plan:   Problem List Items Addressed This Visit      Other   Hyperlipidemia - Primary    Tolerating his atorvastatin well. No concerns. Continue current  regimen. Continue to monitor. Refills given today. Labs drawn today.      Relevant Orders   Comprehensive metabolic panel   Lipid Panel w/o Chol/HDL Ratio    Other Visit Diagnoses    Immunization due       Flu shot given today.   Relevant Orders   Flu Vaccine QUAD 6+ mos PF IM (Fluarix Quad PF) (Completed)       Follow up plan: Return in about 6 months (around 09/20/2019) for Physical.    . This visit was completed via FaceTime due to the restrictions of the COVID-19 pandemic. All issues as above were discussed and addressed. Physical exam was done as above through visual confirmation on FaceTime. If it was felt that the patient should be evaluated in the office, they were directed there. The patient verbally consented to this visit. . Location of the patient: home . Location of the provider: home . Those involved with this call:  . Provider: Park Liter, DO . CMA: Tiffany Reel, CMA . Front Desk/Registration: Don Perking  . Time spent on call: 15 minutes with patient face to face via video conference. More than 50% of this time was spent in counseling and coordination of care. 23 minutes total spent in review of patient's record and preparation of their chart.

## 2019-03-21 LAB — COMPREHENSIVE METABOLIC PANEL
ALT: 29 IU/L (ref 0–44)
AST: 20 IU/L (ref 0–40)
Albumin/Globulin Ratio: 2.5 — ABNORMAL HIGH (ref 1.2–2.2)
Albumin: 4.8 g/dL (ref 4.0–5.0)
Alkaline Phosphatase: 77 IU/L (ref 39–117)
BUN/Creatinine Ratio: 16 (ref 9–20)
BUN: 14 mg/dL (ref 6–24)
Bilirubin Total: 0.6 mg/dL (ref 0.0–1.2)
CO2: 24 mmol/L (ref 20–29)
Calcium: 9 mg/dL (ref 8.7–10.2)
Chloride: 101 mmol/L (ref 96–106)
Creatinine, Ser: 0.9 mg/dL (ref 0.76–1.27)
GFR calc Af Amer: 119 mL/min/{1.73_m2} (ref >59)
GFR calc non Af Amer: 103 mL/min/{1.73_m2} (ref >59)
Globulin, Total: 1.9 g/dL (ref 1.5–4.5)
Glucose: 92 mg/dL (ref 65–99)
Potassium: 4.4 mmol/L (ref 3.5–5.2)
Sodium: 141 mmol/L (ref 134–144)
Total Protein: 6.7 g/dL (ref 6.0–8.5)

## 2019-03-21 LAB — LIPID PANEL W/O CHOL/HDL RATIO
Cholesterol, Total: 114 mg/dL (ref 100–199)
HDL: 21 mg/dL — ABNORMAL LOW (ref >39)
Triglycerides: 469 mg/dL — ABNORMAL HIGH (ref 0–149)

## 2019-03-25 ENCOUNTER — Encounter: Payer: Self-pay | Admitting: Family Medicine

## 2019-06-23 ENCOUNTER — Encounter: Payer: Self-pay | Admitting: Family Medicine

## 2019-08-13 ENCOUNTER — Ambulatory Visit (INDEPENDENT_AMBULATORY_CARE_PROVIDER_SITE_OTHER): Payer: BC Managed Care – PPO | Admitting: Family Medicine

## 2019-08-13 ENCOUNTER — Encounter: Payer: Self-pay | Admitting: Family Medicine

## 2019-08-13 ENCOUNTER — Other Ambulatory Visit: Payer: Self-pay

## 2019-08-13 VITALS — BP 104/64 | HR 60 | Temp 98.7°F | Ht 65.5 in | Wt 199.2 lb

## 2019-08-13 DIAGNOSIS — Z0000 Encounter for general adult medical examination without abnormal findings: Secondary | ICD-10-CM

## 2019-08-13 DIAGNOSIS — E782 Mixed hyperlipidemia: Secondary | ICD-10-CM | POA: Diagnosis not present

## 2019-08-13 DIAGNOSIS — I10 Essential (primary) hypertension: Secondary | ICD-10-CM

## 2019-08-13 LAB — UA/M W/RFLX CULTURE, ROUTINE
Bilirubin, UA: NEGATIVE
Glucose, UA: NEGATIVE
Ketones, UA: NEGATIVE
Leukocytes,UA: NEGATIVE
Nitrite, UA: NEGATIVE
Protein,UA: NEGATIVE
RBC, UA: NEGATIVE
Specific Gravity, UA: 1.025 (ref 1.005–1.030)
Urobilinogen, Ur: 0.2 mg/dL (ref 0.2–1.0)
pH, UA: 5 (ref 5.0–7.5)

## 2019-08-13 LAB — MICROALBUMIN, URINE WAIVED
Creatinine, Urine Waived: 300 mg/dL (ref 10–300)
Microalb, Ur Waived: 30 mg/L — ABNORMAL HIGH (ref 0–19)
Microalb/Creat Ratio: <30 mg/g (ref <30)

## 2019-08-13 MED ORDER — ATORVASTATIN CALCIUM 80 MG PO TABS: 80.0000 mg | ORAL_TABLET | Freq: Every day | ORAL | 1 refills | Status: DC

## 2019-08-13 NOTE — Patient Instructions (Addendum)
We are recommending the vaccine to everyone who has not had an allergic reaction to any of the components of the vaccine. If you have specific questions about the vaccine, please bring them up with your health care provider to discuss them.   We will likely not be getting the vaccine in the office for the first rounds of vaccinations. The way they are releasing the vaccines is going to be through the health systems (like [place], [place], [facility], [place]) or through your county health department.   The [facility] Department is giving vaccines to those 75+ starting 07/31/19  M-F 7AM to 4PM [place] [address], [place], [place] in a drive through tent  If you are 65+ you can get a vaccine through [facility] by signing up for an appointment.  You can sign up by going to: [email or web address].  You can get more information by going to: [email or web address]   Health Maintenance, Male Adopting a healthy lifestyle and getting preventive care are important in promoting health and wellness. Ask your health care provider about:  The right schedule for you to have regular tests and exams.  Things you can do on your own to prevent diseases and keep yourself healthy. What should I know about diet, weight, and exercise? Eat a healthy diet   Eat a diet that includes plenty of vegetables, fruits, low-fat dairy products, and lean protein.  Do not eat a lot of foods that are high in solid fats, added sugars, or sodium. Maintain a healthy weight Body mass index (BMI) is a measurement that can be used to identify possible weight problems. It estimates body fat based on height and weight. Your health care provider can help determine your BMI and help you achieve or maintain a healthy weight. Get regular exercise Get regular exercise. This is one of the most important things you can do for your health. Most adults should:  Exercise for at least  150 minutes each week. The exercise should increase your heart rate and make you sweat (moderate-intensity exercise).  Do strengthening exercises at least twice a week. This is in addition to the moderate-intensity exercise.  Spend less time sitting. Even light physical activity can be beneficial. Watch cholesterol and blood lipids Have your blood tested for lipids and cholesterol at 46 years of age, then have this test every 5 years. You may need to have your cholesterol levels checked more often if:  Your lipid or cholesterol levels are high.  You are older than 46 years of age.  You are at high risk for heart disease. What should I know about cancer screening? Many types of cancers can be detected early and may often be prevented. Depending on your health history and family history, you may need to have cancer screening at various ages. This may include screening for:  Colorectal cancer.  Prostate cancer.  Skin cancer.  Lung cancer. What should I know about heart disease, diabetes, and high blood pressure? Blood pressure and heart disease  High blood pressure causes heart disease and increases the risk of stroke. This is more likely to develop in people who have high blood pressure readings, are of African descent, or are overweight.  Talk with your health care provider about your target blood pressure readings.  Have your blood pressure checked: ? Every 3-5 years if you are 3-11 years of age. ? Every year if you are 64 years old or older.  If you are between  the ages of 65 and 56 and are a current or former smoker, ask your health care provider if you should have a one-time screening for abdominal aortic aneurysm (AAA). Diabetes Have regular diabetes screenings. This checks your fasting blood sugar level. Have the screening done:  Once every three years after age 110 if you are at a normal weight and have a low risk for diabetes.  More often and at a younger age if you  are overweight or have a high risk for diabetes. What should I know about preventing infection? Hepatitis B If you have a higher risk for hepatitis B, you should be screened for this virus. Talk with your health care provider to find out if you are at risk for hepatitis B infection. Hepatitis C Blood testing is recommended for:  Everyone born from [birth year] through 1967.  Anyone with known risk factors for hepatitis C. Sexually transmitted infections (STIs)  You should be screened each year for STIs, including gonorrhea and chlamydia, if: ? You are sexually active and are younger than 46 years of age. ? You are older than 46 years of age and your health care provider tells you that you are at risk for this type of infection. ? Your sexual activity has changed since you were last screened, and you are at increased risk for chlamydia or gonorrhea. Ask your health care provider if you are at risk.  Ask your health care provider about whether you are at high risk for HIV. Your health care provider may recommend a prescription medicine to help prevent HIV infection. If you choose to take medicine to prevent HIV, you should first get tested for HIV. You should then be tested every 3 months for as long as you are taking the medicine. Follow these instructions at home: Lifestyle  Do not use any products that contain nicotine or tobacco, such as cigarettes, e-cigarettes, and chewing tobacco. If you need help quitting, ask your health care provider.  Do not use street drugs.  Do not share needles.  Ask your health care provider for help if you need support or information about quitting drugs. Alcohol use  Do not drink alcohol if your health care provider tells you not to drink.  If you drink alcohol: ? Limit how much you have to 0-2 drinks a day. ? Be aware of how much alcohol is in your drink. In the [place] one drink equals one 12 oz bottle of beer (355 mL), one 5 oz glass of wine (148 mL), or one  1 oz glass of hard liquor (44 mL). General instructions  Schedule regular health, dental, and eye exams.  Stay current with your vaccines.  Tell your health care provider if: ? You often feel depressed. ? You have ever been abused or do not feel safe at home. Summary  Adopting a healthy lifestyle and getting preventive care are important in promoting health and wellness.  Follow your health care provider's instructions about healthy diet, exercising, and getting tested or screened for diseases.  Follow your health care provider's instructions on monitoring your cholesterol and blood pressure. This information is not intended to replace advice given to you by your health care provider. Make sure you discuss any questions you have with your health care provider. Document Revised: 07/04/2018 Document Reviewed: 07/04/2018 Elsevier Patient Education  2020 [employer].

## 2019-08-13 NOTE — Assessment & Plan Note (Signed)
BP doing great not on medicine. Continue diet and exercise. Continue to monitor.

## 2019-08-13 NOTE — Assessment & Plan Note (Signed)
Under good control on current regimen. Continue current regimen. Continue to monitor. Call with any concerns. Refills given. Labs drawn today.   

## 2019-08-13 NOTE — Progress Notes (Signed)
BP 104/64   Pulse 60   Temp 98.7 F (37.1 C) (Oral)   Ht 5' 5.5" (1.664 m)   Wt 199 lb 3.2 oz (90.4 kg)   SpO2 98%   BMI 32.64 kg/m    Subjective:    Patient ID: Angel Montes, male    DOB: Oct 26, 1975, 44 y.o.   MRN: [id number]  HPI: Angel Montes is a 44 y.o. male presenting on 08/13/2019 for comprehensive medical examination. Current medical complaints include:  HYPERTENSION / HYPERLIPIDEMIA Satisfied with current treatment? yes Duration of hypertension: chronic BP monitoring frequency: not checking BP medication side effects: not on anything Past BP meds: none Duration of hyperlipidemia: chronic Cholesterol medication side effects: no Cholesterol supplements: none Past cholesterol medications: atorvastatin Medication compliance: excellent compliance Aspirin: no Recent stressors: no Recurrent headaches: no Visual changes: no Palpitations: no Dyspnea: no Chest pain: no Lower extremity edema: no Dizzy/lightheaded: no  He currently lives with: wife Interim Problems from his last visit: no  Depression Screen done today and results listed below:  Depression screen [facility] 2/9 08/13/2019 03/20/2019 06/07/2018 01/05/2017  Decreased Interest 0 0 0 0  Down, Depressed, Hopeless 0 0 0 0  PHQ - 2 Score 0 0 0 0  Altered sleeping 0 - 1 -  Tired, decreased energy 0 - 0 -  Change in appetite 0 - 0 -  Feeling bad or failure about yourself  0 - 0 -  Trouble concentrating 0 - 0 -  Moving slowly or fidgety/restless 0 - 0 -  Suicidal thoughts 0 - 0 -  PHQ-9 Score 0 - 1 -  Difficult doing work/chores Not difficult at all - - -     Past Medical History:  Past Medical History:  Diagnosis Date  . Asthma   . Hyperlipidemia   . Hypertension   . Sciatica     Surgical History:  Past Surgical History:  Procedure Laterality Date  . CHOLECYSTECTOMY      Medications:  No current outpatient medications on file prior to visit.   No current facility-administered medications on  file prior to visit.    Allergies:  No Known Allergies  Social History:  Social History   Socioeconomic History  . Marital status: Married    Spouse name: Not on file  . Number of children: Not on file  . Years of education: Not on file  . Highest education level: Not on file  Occupational History  . Not on file  Tobacco Use  . Smoking status: Never Smoker  . Smokeless tobacco: Never Used  Substance and Sexual Activity  . Alcohol use: Yes    Comment: On occasion/ socially  . Drug use: No  . Sexual activity: Yes  Other Topics Concern  . Not on file  Social History Narrative  . Not on file   Social Determinants of Health   Financial Resource Strain:   . Difficulty of Paying Living Expenses: Not on file  Food Insecurity:   . Worried About Charity fundraiser in the Last Year: Not on file  . Ran Out of Food in the Last Year: Not on file  Transportation Needs:   . Lack of Transportation (Medical): Not on file  . Lack of Transportation (Non-Medical): Not on file  Physical Activity:   . Days of Exercise per Week: Not on file  . Minutes of Exercise per Session: Not on file  Stress:   . Feeling of Stress : Not on file  Social Connections:   .  Frequency of Communication with Friends and Family: Not on file  . Frequency of Social Gatherings with Friends and Family: Not on file  . Attends Religious Services: Not on file  . Active Member of Clubs or Organizations: Not on file  . Attends Archivist Meetings: Not on file  . Marital Status: Not on file  Intimate Partner Violence:   . Fear of Current or Ex-Partner: Not on file  . Emotionally Abused: Not on file  . Physically Abused: Not on file  . Sexually Abused: Not on file   Social History   Tobacco Use  Smoking Status Never Smoker  Smokeless Tobacco Never Used   Social History   Substance and Sexual Activity  Alcohol Use Yes   Comment: On occasion/ socially    Family History:  Family History    Problem Relation Age of Onset  . Hypertension Mother     Past medical history, surgical history, medications, allergies, family history and social history reviewed with patient today and changes made to appropriate areas of the chart.   Review of Systems  Constitutional: Negative.   HENT: Negative.   Eyes: Negative.   Respiratory: Negative.   Cardiovascular: Negative.   Gastrointestinal: Negative.   Genitourinary: Negative.   Musculoskeletal: Negative.   Skin: Negative.   Neurological: Negative.   Endo/Heme/Allergies: Negative.   Psychiatric/Behavioral: Negative.     All other ROS negative except what is listed above and in the HPI.      Objective:    BP 104/64   Pulse 60   Temp 98.7 F (37.1 C) (Oral)   Ht 5' 5.5" (1.664 m)   Wt 199 lb 3.2 oz (90.4 kg)   SpO2 98%   BMI 32.64 kg/m   Wt Readings from Last 3 Encounters:  08/13/19 199 lb 3.2 oz (90.4 kg)  03/20/19 193 lb 2 oz (87.6 kg)  02/07/19 200 lb (90.7 kg)    Physical Exam Vitals and nursing note reviewed.  Constitutional:      General: He is not in acute distress.    Appearance: Normal appearance. He is obese. He is not ill-appearing, toxic-appearing or diaphoretic.  HENT:     Head: Normocephalic and atraumatic.     Right Ear: Tympanic membrane, ear canal and external ear normal. There is no impacted cerumen.     Left Ear: Tympanic membrane, ear canal and external ear normal. There is no impacted cerumen.     Nose: Nose normal. No congestion or rhinorrhea.     Mouth/Throat:     Mouth: Mucous membranes are moist.     Pharynx: Oropharynx is clear. No oropharyngeal exudate or posterior oropharyngeal erythema.  Eyes:     General: No scleral icterus.       Right eye: No discharge.        Left eye: No discharge.     Extraocular Movements: Extraocular movements intact.     Conjunctiva/sclera: Conjunctivae normal.     Pupils: Pupils are equal, round, and reactive to light.  Neck:     Vascular: No carotid  bruit.  Cardiovascular:     Rate and Rhythm: Normal rate and regular rhythm.     Pulses: Normal pulses.     Heart sounds: No murmur. No friction rub. No gallop.   Pulmonary:     Effort: Pulmonary effort is normal. No respiratory distress.     Breath sounds: Normal breath sounds. No stridor. No wheezing, rhonchi or rales.  Chest:     Chest  wall: No tenderness.  Abdominal:     General: Abdomen is flat. Bowel sounds are normal. There is no distension.     Palpations: Abdomen is soft. There is no mass.     Tenderness: There is no abdominal tenderness. There is no right CVA tenderness, left CVA tenderness, guarding or rebound.     Hernia: No hernia is present.  Genitourinary:    Comments: Genital exam deferred with shared decision making Musculoskeletal:        General: No swelling, tenderness, deformity or signs of injury.     Cervical back: Normal range of motion and neck supple. No rigidity. No muscular tenderness.     Right lower leg: No edema.     Left lower leg: No edema.  Lymphadenopathy:     Cervical: No cervical adenopathy.  Skin:    General: Skin is warm and dry.     Capillary Refill: Capillary refill takes less than 2 seconds.     Coloration: Skin is not jaundiced or pale.     Findings: No bruising, erythema, lesion or rash.  Neurological:     General: No focal deficit present.     Mental Status: He is alert and oriented to person, place, and time.     Cranial Nerves: No cranial nerve deficit.     Sensory: No sensory deficit.     Motor: No weakness.     Coordination: Coordination normal.     Gait: Gait normal.     Deep Tendon Reflexes: Reflexes normal.  Psychiatric:        Mood and Affect: Mood normal.        Behavior: Behavior normal.        Thought Content: Thought content normal.        Judgment: Judgment normal.     Results for orders placed or performed in visit on 03/20/19  Comprehensive metabolic panel  Result Value Ref Range   Glucose 92 65 - 99 mg/dL    BUN 14 6 - 24 mg/dL   Creatinine, Ser 0.90 0.76 - 1.27 mg/dL   GFR calc non Af Amer 103 >59 mL/min/1.73   GFR calc Af Amer 119 >59 mL/min/1.73   BUN/Creatinine Ratio 16 9 - 20   Sodium 141 134 - 144 mmol/L   Potassium 4.4 3.5 - 5.2 mmol/L   Chloride 101 96 - 106 mmol/L   CO2 24 20 - 29 mmol/L   Calcium 9.0 8.7 - 10.2 mg/dL   Total Protein 6.7 6.0 - 8.5 g/dL   Albumin 4.8 4.0 - 5.0 g/dL   Globulin, Total 1.9 1.5 - 4.5 g/dL   Albumin/Globulin Ratio 2.5 (H) 1.2 - 2.2   Bilirubin Total 0.6 0.0 - 1.2 mg/dL   Alkaline Phosphatase 77 39 - 117 IU/L   AST 20 0 - 40 IU/L   ALT 29 0 - 44 IU/L  Lipid Panel w/o Chol/HDL Ratio  Result Value Ref Range   Cholesterol, Total 114 100 - 199 mg/dL   Triglycerides 469 (H) 0 - 149 mg/dL   HDL 21 (L) >39 mg/dL   VLDL Cholesterol Cal Comment 5 - 40 mg/dL   LDL Calculated Comment 0 - 99 mg/dL      Assessment & Plan:   Problem List Items Addressed This Visit      Cardiovascular and Mediastinum   Hypertension    BP doing great not on medicine. Continue diet and exercise. Continue to monitor.       Relevant Medications  atorvastatin (LIPITOR) 80 MG tablet   Other Relevant Orders   Comprehensive metabolic panel   Microalbumin, Urine Waived     Other   Hyperlipidemia    Under good control on current regimen. Continue current regimen. Continue to monitor. Call with any concerns. Refills given. Labs drawn today.       Relevant Medications   atorvastatin (LIPITOR) 80 MG tablet   Other Relevant Orders   Comprehensive metabolic panel   Lipid Panel w/o Chol/HDL Ratio    Other Visit Diagnoses    Routine general medical examination at a health care facility    -  Primary   Vaccines up to date. Screening labs checked today. Continue diet and exercise. Call with any concerns.    Relevant Orders   CBC with Differential/Platelet   Comprehensive metabolic panel   Lipid Panel w/o Chol/HDL Ratio   Microalbumin, Urine Waived   TSH   UA/M w/rflx  Culture, Routine      LABORATORY TESTING:  Health maintenance labs ordered today as discussed above.   IMMUNIZATIONS:   - Tdap: Tetanus vaccination status reviewed: last tetanus booster within 10 years. - Influenza: Up to date - Pneumovax: Not applicable  PATIENT COUNSELING:    Sexuality: Discussed sexually transmitted diseases, partner selection, use of condoms, avoidance of unintended pregnancy  and contraceptive alternatives.   Advised to avoid cigarette smoking.  I discussed with the patient that most people either abstain from alcohol or drink within safe limits (<=14/week and <=4 drinks/occasion for males, <=7/weeks and <= 3 drinks/occasion for females) and that the risk for alcohol disorders and other health effects rises proportionally with the number of drinks per week and how often a drinker exceeds daily limits.  Discussed cessation/primary prevention of drug use and availability of treatment for abuse.   Diet: Encouraged to adjust caloric intake to maintain  or achieve ideal body weight, to reduce intake of dietary saturated fat and total fat, to limit sodium intake by avoiding high sodium foods and not adding table salt, and to maintain adequate dietary potassium and calcium preferably from fresh fruits, vegetables, and low-fat dairy products.    stressed the importance of regular exercise  Injury prevention: Discussed safety belts, safety helmets, smoke detector, smoking near bedding or upholstery.   Dental health: Discussed importance of regular tooth brushing, flossing, and dental visits.   Follow up plan: NEXT PREVENTATIVE PHYSICAL DUE IN 1 YEAR. Return in about 6 months (around 02/10/2020).

## 2019-08-14 LAB — LIPID PANEL W/O CHOL/HDL RATIO
Cholesterol, Total: 131 mg/dL (ref 100–199)
HDL: 22 mg/dL — ABNORMAL LOW (ref >39)
Triglycerides: 809 mg/dL (ref 0–149)

## 2019-08-14 LAB — CBC WITH DIFFERENTIAL/PLATELET
Basophils Absolute: 0 10*3/uL (ref 0.0–0.2)
Basos: 1 %
EOS (ABSOLUTE): 0.1 10*3/uL (ref 0.0–0.4)
Eos: 1 %
Hematocrit: 47.4 % (ref 37.5–51.0)
Hemoglobin: 15.6 g/dL (ref 13.0–17.7)
Immature Grans (Abs): 0 10*3/uL (ref 0.0–0.1)
Immature Granulocytes: 0 %
Lymphocytes Absolute: 1.7 10*3/uL (ref 0.7–3.1)
Lymphs: 30 %
MCH: 30.4 pg (ref 26.6–33.0)
MCHC: 32.9 g/dL (ref 31.5–35.7)
MCV: 92 fL (ref 79–97)
Monocytes Absolute: 0.4 10*3/uL (ref 0.1–0.9)
Monocytes: 7 %
Neutrophils Absolute: 3.4 10*3/uL (ref 1.4–7.0)
Neutrophils: 61 %
Platelets: 171 10*3/uL (ref 150–450)
RBC: 5.14 x10E6/uL (ref 4.14–5.80)
RDW: 14.3 % (ref 11.6–15.4)
WBC: 5.5 10*3/uL (ref 3.4–10.8)

## 2019-08-14 LAB — COMPREHENSIVE METABOLIC PANEL
ALT: 33 IU/L (ref 0–44)
AST: 25 IU/L (ref 0–40)
Albumin/Globulin Ratio: 2.3 — ABNORMAL HIGH (ref 1.2–2.2)
Albumin: 4.5 g/dL (ref 4.0–5.0)
Alkaline Phosphatase: 90 IU/L (ref 39–117)
BUN/Creatinine Ratio: 17 (ref 9–20)
BUN: 15 mg/dL (ref 6–24)
Bilirubin Total: 0.4 mg/dL (ref 0.0–1.2)
CO2: 24 mmol/L (ref 20–29)
Calcium: 9.2 mg/dL (ref 8.7–10.2)
Chloride: 104 mmol/L (ref 96–106)
Creatinine, Ser: 0.88 mg/dL (ref 0.76–1.27)
GFR calc Af Amer: 120 mL/min/{1.73_m2} (ref >59)
GFR calc non Af Amer: 104 mL/min/{1.73_m2} (ref >59)
Globulin, Total: 2 g/dL (ref 1.5–4.5)
Glucose: 99 mg/dL (ref 65–99)
Potassium: 4.7 mmol/L (ref 3.5–5.2)
Sodium: 141 mmol/L (ref 134–144)
Total Protein: 6.5 g/dL (ref 6.0–8.5)

## 2019-08-14 LAB — TSH: TSH: 1.88 u[IU]/mL (ref 0.450–4.500)

## 2020-02-11 ENCOUNTER — Ambulatory Visit: Payer: BC Managed Care – PPO | Admitting: Family Medicine

## 2020-03-04 ENCOUNTER — Other Ambulatory Visit: Payer: Self-pay

## 2020-03-04 ENCOUNTER — Encounter: Payer: Self-pay | Admitting: Family Medicine

## 2020-03-04 ENCOUNTER — Ambulatory Visit (INDEPENDENT_AMBULATORY_CARE_PROVIDER_SITE_OTHER): Payer: BC Managed Care – PPO | Admitting: Family Medicine

## 2020-03-04 VITALS — BP 128/89 | HR 63 | Temp 98.9°F | Wt 199.8 lb

## 2020-03-04 DIAGNOSIS — Z1159 Encounter for screening for other viral diseases: Secondary | ICD-10-CM

## 2020-03-04 DIAGNOSIS — G4700 Insomnia, unspecified: Secondary | ICD-10-CM | POA: Diagnosis not present

## 2020-03-04 DIAGNOSIS — E782 Mixed hyperlipidemia: Secondary | ICD-10-CM | POA: Diagnosis not present

## 2020-03-04 DIAGNOSIS — I10 Essential (primary) hypertension: Secondary | ICD-10-CM

## 2020-03-04 MED ORDER — ATORVASTATIN CALCIUM 80 MG PO TABS: 80.0000 mg | ORAL_TABLET | Freq: Every day | ORAL | 1 refills | Status: DC

## 2020-03-04 NOTE — Assessment & Plan Note (Signed)
Under good control not on any medicine. Continue to monitor. Call with any concerns.   

## 2020-03-04 NOTE — Progress Notes (Signed)
BP 128/89 (BP Location: Left Arm, Patient Position: Sitting, Cuff Size: Normal)   Pulse 63   Temp 98.9 F (37.2 C) (Oral)   Wt 199 lb 12.8 oz (90.6 kg)   SpO2 98%   BMI 32.74 kg/m    Subjective:    Patient ID: Angel Montes, male    DOB: March 25, 1976, 44 y.o.   MRN: [id number]  HPI: Angel Montes is a 44 y.o. male  Chief Complaint  Patient presents with  . Hypertension  . Hyperlipidemia   HYPERTENSION / HYPERLIPIDEMIA Satisfied with current treatment? yes Duration of hypertension: chronic BP monitoring frequency: not checking BP medication side effects: no Past BP meds: none Duration of hyperlipidemia: chronic Cholesterol medication side effects: no Cholesterol supplements: none Past cholesterol medications: atorvastatin Medication compliance: excellent compliance Aspirin: no Recent stressors: no Recurrent headaches: no Visual changes: no Palpitations: no Dyspnea: no Chest pain: no Lower extremity edema: no Dizzy/lightheaded: no  INSOMNIA Duration: chronic, worsening Satisfied with sleep quality: yes Difficulty falling asleep: no Difficulty staying asleep: yes Waking a few hours after sleep onset: yes Early morning awakenings: yes Daytime hypersomnolence: no Wakes feeling refreshed: yes Good sleep hygiene: no Apnea: no Snoring: no Depressed/anxious mood: no Recent stress: yes Restless legs/nocturnal leg cramps: no Chronic pain/arthritis: no History of sleep study: no Treatments attempted: benadryl    Relevant past medical, surgical, family and social history reviewed and updated as indicated. Interim medical history since our last visit reviewed. Allergies and medications reviewed and updated.  Review of Systems  Constitutional: Negative.   Respiratory: Negative.   Cardiovascular: Negative.   Gastrointestinal: Negative.   Musculoskeletal: Negative.   Neurological: Negative.   Psychiatric/Behavioral: Positive for sleep disturbance. Negative for  agitation, behavioral problems, confusion, decreased concentration, dysphoric mood, hallucinations, self-injury and suicidal ideas. The patient is not nervous/anxious and is not hyperactive.     Per HPI unless specifically indicated above     Objective:    BP 128/89 (BP Location: Left Arm, Patient Position: Sitting, Cuff Size: Normal)   Pulse 63   Temp 98.9 F (37.2 C) (Oral)   Wt 199 lb 12.8 oz (90.6 kg)   SpO2 98%   BMI 32.74 kg/m   Wt Readings from Last 3 Encounters:  03/04/20 199 lb 12.8 oz (90.6 kg)  08/13/19 199 lb 3.2 oz (90.4 kg)  03/20/19 193 lb 2 oz (87.6 kg)    Physical Exam Vitals and nursing note reviewed.  Constitutional:      General: He is not in acute distress.    Appearance: Normal appearance. He is not ill-appearing, toxic-appearing or diaphoretic.  HENT:     Head: Normocephalic and atraumatic.     Right Ear: External ear normal.     Left Ear: External ear normal.     Nose: Nose normal.     Mouth/Throat:     Mouth: Mucous membranes are moist.     Pharynx: Oropharynx is clear.  Eyes:     General: No scleral icterus.       Right eye: No discharge.        Left eye: No discharge.     Extraocular Movements: Extraocular movements intact.     Conjunctiva/sclera: Conjunctivae normal.     Pupils: Pupils are equal, round, and reactive to light.  Cardiovascular:     Rate and Rhythm: Normal rate and regular rhythm.     Pulses: Normal pulses.     Heart sounds: Normal heart sounds. No murmur heard.  No friction rub. No  gallop.   Pulmonary:     Effort: Pulmonary effort is normal. No respiratory distress.     Breath sounds: Normal breath sounds. No stridor. No wheezing, rhonchi or rales.  Chest:     Chest wall: No tenderness.  Musculoskeletal:        General: Tenderness:   Normal range of motion.     Cervical back: Normal range of motion and neck supple.  Skin:    General: Skin is warm and dry.     Capillary Refill: Capillary refill takes less than 2  seconds.     Coloration: Skin is not jaundiced or pale.     Findings: No bruising, erythema, lesion or rash.  Neurological:     General: No focal deficit present.     Mental Status: He is alert and oriented to person, place, and time. Mental status is at baseline.  Psychiatric:        Mood and Affect: Mood normal.        Behavior: Behavior normal.        Thought Content: Thought content normal.        Judgment: Judgment normal.     Results for orders placed or performed in visit on 08/13/19  CBC with Differential/Platelet  Result Value Ref Range   WBC 5.5 3.4 - 10.8 x10E3/uL   RBC 5.14 4.14 - 5.80 x10E6/uL   Hemoglobin 15.6 13.0 - 17.7 g/dL   Hematocrit 47.4 37.5 - 51.0 %   MCV 92 79 - 97 fL   MCH 30.4 26.6 - 33.0 pg   MCHC 32.9 31 - 35 g/dL   RDW 14.3 11.6 - 15.4 %   Platelets 171 150 - 450 x10E3/uL   Neutrophils 61 Not Estab. %   Lymphs 30 Not Estab. %   Monocytes 7 Not Estab. %   Eos 1 Not Estab. %   Basos 1 Not Estab. %   Neutrophils Absolute 3.4 1 - 7 x10E3/uL   Lymphocytes Absolute 1.7 0 - 3 x10E3/uL   Monocytes Absolute 0.4 0 - 0 x10E3/uL   EOS (ABSOLUTE) 0.1 0.0 - 0.4 x10E3/uL   Basophils Absolute 0.0 0 - 0 x10E3/uL   Immature Granulocytes 0 Not Estab. %   Immature Grans (Abs) 0.0 0.0 - 0.1 x10E3/uL  Comprehensive metabolic panel  Result Value Ref Range   Glucose 99 65 - 99 mg/dL   BUN 15 6 - 24 mg/dL   Creatinine, Ser 0.88 0.76 - 1.27 mg/dL   GFR calc non Af Amer 104 >59 mL/min/1.73   GFR calc Af Amer 120 >59 mL/min/1.73   BUN/Creatinine Ratio 17 9 - 20   Sodium 141 134 - 144 mmol/L   Potassium 4.7 3.5 - 5.2 mmol/L   Chloride 104 96 - 106 mmol/L   CO2 24 20 - 29 mmol/L   Calcium 9.2 8.7 - 10.2 mg/dL   Total Protein 6.5 6.0 - 8.5 g/dL   Albumin 4.5 4.0 - 5.0 g/dL   Globulin, Total 2.0 1.5 - 4.5 g/dL   Albumin/Globulin Ratio 2.3 (H) 1.2 - 2.2   Bilirubin Total 0.4 0.0 - 1.2 mg/dL   Alkaline Phosphatase 90 39 - 117 IU/L   AST 25 0 - 40 IU/L   ALT 33 0 -  44 IU/L  Lipid Panel w/o Chol/HDL Ratio  Result Value Ref Range   Cholesterol, Total 131 100 - 199 mg/dL   Triglycerides 809 (HH) 0 - 149 mg/dL   HDL 22 (L) >39 mg/dL   VLDL  Cholesterol Cal Comment (A) 5 - 40 mg/dL   LDL Chol Calc (NIH) Comment (A) 0 - 99 mg/dL  Microalbumin, Urine Waived  Result Value Ref Range   Microalb, Ur Waived 30 (H) 0 - 19 mg/L   Creatinine, Urine Waived 300 10 - 300 mg/dL   Microalb/Creat Ratio <30 <30 mg/g  TSH  Result Value Ref Range   TSH 1.880 0.450 - 4.500 uIU/mL  UA/M w/rflx Culture, Routine   Specimen: Urine   URINE  Result Value Ref Range   Specific Gravity, UA 1.025 1.005 - 1.030   pH, UA 5.0 5.0 - 7.5   Color, UA Yellow Yellow   Appearance Ur Clear Clear   Leukocytes,UA Negative Negative   Protein,UA Negative Negative/Trace   Glucose, UA Negative Negative   Ketones, UA Negative Negative   RBC, UA Negative Negative   Bilirubin, UA Negative Negative   Urobilinogen, Ur 0.2 0.2 - 1.0 mg/dL   Nitrite, UA Negative Negative      Assessment & Plan:   Problem List Items Addressed This Visit      Cardiovascular and Mediastinum   Hypertension - Primary    Under good control not on any medicine. Continue to monitor. Call with any concerns.       Relevant Medications   atorvastatin (LIPITOR) 80 MG tablet   Other Relevant Orders   Comprehensive metabolic panel     Other   Hyperlipidemia    Under good control on current regimen. Continue current regimen. Continue to monitor. Call with any concerns. Refills given. Labs drawn today.        Relevant Medications   atorvastatin (LIPITOR) 80 MG tablet   Other Relevant Orders   Comprehensive metabolic panel   Lipid Panel w/o Chol/HDL Ratio    Other Visit Diagnoses    Insomnia, unspecified type       Discussed sleep hygiene. Will try melatonin and PRN z-quil. Call if worsening or not improving.    Need for hepatitis C screening test       Labs drawn today. Await results.   Relevant Orders     Hepatitis C Antibody       Follow up plan: Return in about 6 months (around 09/04/2020) for physical.

## 2020-03-04 NOTE — Assessment & Plan Note (Signed)
Under good control on current regimen. Continue current regimen. Continue to monitor. Call with any concerns. Refills given. Labs drawn today.   

## 2020-03-05 LAB — COMPREHENSIVE METABOLIC PANEL
ALT: 37 IU/L (ref 0–44)
AST: 25 IU/L (ref 0–40)
Albumin/Globulin Ratio: 1.8 (ref 1.2–2.2)
Albumin: 4.5 g/dL (ref 4.0–5.0)
Alkaline Phosphatase: 85 IU/L (ref 48–121)
BUN/Creatinine Ratio: 15 (ref 9–20)
BUN: 13 mg/dL (ref 6–24)
Bilirubin Total: 0.6 mg/dL (ref 0.0–1.2)
CO2: 22 mmol/L (ref 20–29)
Calcium: 9.2 mg/dL (ref 8.7–10.2)
Chloride: 104 mmol/L (ref 96–106)
Creatinine, Ser: 0.85 mg/dL (ref 0.76–1.27)
GFR calc Af Amer: 121 mL/min/{1.73_m2} (ref >59)
GFR calc non Af Amer: 104 mL/min/{1.73_m2} (ref >59)
Globulin, Total: 2.5 g/dL (ref 1.5–4.5)
Glucose: 85 mg/dL (ref 65–99)
Potassium: 4.5 mmol/L (ref 3.5–5.2)
Sodium: 140 mmol/L (ref 134–144)
Total Protein: 7 g/dL (ref 6.0–8.5)

## 2020-03-05 LAB — LIPID PANEL W/O CHOL/HDL RATIO
Cholesterol, Total: 125 mg/dL (ref 100–199)
HDL: 24 mg/dL — ABNORMAL LOW (ref >39)
LDL Chol Calc (NIH): 29 mg/dL (ref 0–99)
Triglycerides: 526 mg/dL — ABNORMAL HIGH (ref 0–149)
VLDL Cholesterol Cal: 72 mg/dL — ABNORMAL HIGH (ref 5–40)

## 2020-03-05 LAB — HEPATITIS C ANTIBODY: Hep C Virus Ab: 0.1 s/co ratio (ref 0.0–0.9)

## 2020-03-18 ENCOUNTER — Other Ambulatory Visit: Payer: Self-pay | Admitting: Family Medicine

## 2020-07-29 ENCOUNTER — Telehealth (INDEPENDENT_AMBULATORY_CARE_PROVIDER_SITE_OTHER): Payer: BC Managed Care – PPO | Admitting: Family Medicine

## 2020-07-29 ENCOUNTER — Encounter: Payer: Self-pay | Admitting: Family Medicine

## 2020-07-29 DIAGNOSIS — J069 Acute upper respiratory infection, unspecified: Secondary | ICD-10-CM | POA: Diagnosis not present

## 2020-07-29 NOTE — Patient Instructions (Addendum)
It was great to see you!  Our plans for today:  - See below for self-isolation guidelines. You may end your quarantine if your test is negative or, if positive, once you are 10 days from symptom onset and fever free for 24 hours without use of tylenol or ibuprofen.  - If your test is positive, we will be referring you for monoclonal antibody treatment. Someone will be contacting you about scheduling this if you qualify. We are experiencing a Producer, television/film/video of this and the oral antivirals as well as a backlog of patients needing treatment so this may cause a delay. - You can continue to take over the counter cold/sinus medication such as dayquil or nyquil for your symptoms.  - Certainly, if you are having difficulties breathing or unable to keep down fluids, go to the Emergency Department.   Take care and seek immediate care sooner if you develop any concerns.   Dr. Ky Barban     Person Under Monitoring Name: Angel Montes  Location: [address] [place] [address]   Infection Prevention Recommendations for Individuals Confirmed to have, or Being Evaluated for, 2019 Novel Coronavirus (COVID-19) Infection Who Receive Care at Home  Individuals who are confirmed to have, or are being evaluated for, COVID-19 should follow the prevention steps below until a healthcare provider or local or state health department says they can return to normal activities.  Stay home except to get medical care You should restrict activities outside your home, except for getting medical care. Do not go to work, school, or public areas, and do not use public transportation or taxis.  Call ahead before visiting your doctor Before your medical appointment, call the healthcare provider and tell them that you have, or are being evaluated for, COVID-19 infection. This will help the healthcare provider's office take steps to keep other people from getting infected. Ask your healthcare provider to call the local  or state health department.  Monitor your symptoms Seek prompt medical attention if your illness is worsening (e.g., difficulty breathing). Before going to your medical appointment, call the healthcare provider and tell them that you have, or are being evaluated for, COVID-19 infection. Ask your healthcare provider to call the local or state health department.  Wear a facemask You should wear a facemask that covers your nose and mouth when you are in the same room with other people and when you visit a healthcare provider. People who live with or visit you should also wear a facemask while they are in the same room with you.  Separate yourself from other people in your home As much as possible, you should stay in a different room from other people in your home. Also, you should use a separate bathroom, if available.  Avoid sharing household items You should not share dishes, drinking glasses, cups, eating utensils, towels, bedding, or other items with other people in your home. After using these items, you should wash them thoroughly with soap and water.  Cover your coughs and sneezes Cover your mouth and nose with a tissue when you cough or sneeze, or you can cough or sneeze into your sleeve. Throw used tissues in a lined trash can, and immediately wash your hands with soap and water for at least 20 seconds or use an alcohol-based hand rub.  Wash your [facility] your hands often and thoroughly with soap and water for at least 20 seconds. You can use an alcohol-based hand sanitizer if soap and water are not  available and if your hands are not visibly dirty. Avoid touching your eyes, nose, and mouth with unwashed hands.   Prevention Steps for Caregivers and Household Members of Individuals Confirmed to have, or Being Evaluated for, COVID-19 Infection Being Cared for in the Home  If you live with, or provide care at home for, a person confirmed to have, or being evaluated for,  COVID-19 infection please follow these guidelines to prevent infection:  Follow healthcare provider's instructions Make sure that you understand and can help the patient follow any healthcare provider instructions for all care.  Provide for the patient's basic needs You should help the patient with basic needs in the home and provide support for getting groceries, prescriptions, and other personal needs.  Monitor the patient's symptoms If they are getting sicker, call his or her medical provider and tell them that the patient has, or is being evaluated for, COVID-19 infection. This will help the healthcare provider's office take steps to keep other people from getting infected. Ask the healthcare provider to call the local or state health department.  Limit the number of people who have contact with the patient  If possible, have only one caregiver for the patient.  Other household members should stay in another home or place of residence. If this is not possible, they should stay  in another room, or be separated from the patient as much as possible. Use a separate bathroom, if available.  Restrict visitors who do not have an essential need to be in the home.  Keep older adults, very young children, and other sick people away from the patient Keep older adults, very young children, and those who have compromised immune systems or chronic health conditions away from the patient. This includes people with chronic heart, lung, or kidney conditions, diabetes, and cancer.  Ensure good ventilation Make sure that shared spaces in the home have good air flow, such as from an air conditioner or an opened window, weather permitting.  Wash your hands often  Wash your hands often and thoroughly with soap and water for at least 20 seconds. You can use an alcohol based hand sanitizer if soap and water are not available and if your hands are not visibly dirty.  Avoid touching your eyes, nose,  and mouth with unwashed hands.  Use disposable paper towels to dry your hands. If not available, use dedicated cloth towels and replace them when they become wet.  Wear a facemask and gloves  Wear a disposable facemask at all times in the room and gloves when you touch or have contact with the patient's blood, body fluids, and/or secretions or excretions, such as sweat, saliva, sputum, nasal mucus, vomit, urine, or feces.  Ensure the mask fits over your nose and mouth tightly, and do not touch it during use.  Throw out disposable facemasks and gloves after using them. Do not reuse.  Wash your hands immediately after removing your facemask and gloves.  If your personal clothing becomes contaminated, carefully remove clothing and launder. Wash your hands after handling contaminated clothing.  Place all used disposable facemasks, gloves, and other waste in a lined container before disposing them with other household waste.  Remove gloves and wash your hands immediately after handling these items.  Do not share dishes, glasses, or other household items with the patient  Avoid sharing household items. You should not share dishes, drinking glasses, cups, eating utensils, towels, bedding, or other items with a patient who is confirmed to have,  or being evaluated for, COVID-19 infection.  After the person uses these items, you should wash them thoroughly with soap and water.  Wash laundry thoroughly  Immediately remove and wash clothes or bedding that have blood, body fluids, and/or secretions or excretions, such as sweat, saliva, sputum, nasal mucus, vomit, urine, or feces, on them.  Wear gloves when handling laundry from the patient.  Read and follow directions on labels of laundry or clothing items and detergent. In general, wash and dry with the warmest temperatures recommended on the label.  Clean all areas the individual has used often  Clean all touchable surfaces, such as counters,  tabletops, doorknobs, bathroom fixtures, toilets, phones, keyboards, tablets, and bedside tables, every day. Also, clean any surfaces that may have blood, body fluids, and/or secretions or excretions on them.  Wear gloves when cleaning surfaces the patient has come in contact with.  Use a diluted bleach solution (e.g., dilute bleach with 1 part bleach and 10 parts water) or a household disinfectant with a label that says EPA-registered for coronaviruses. To make a bleach solution at home, add 1 tablespoon of bleach to 1 quart (4 cups) of water. For a larger supply, add  cup of bleach to 1 gallon (16 cups) of water.  Read labels of cleaning products and follow recommendations provided on product labels. Labels contain instructions for safe and effective use of the cleaning product including precautions you should take when applying the product, such as wearing gloves or eye protection and making sure you have good ventilation during use of the product.  Remove gloves and wash hands immediately after cleaning.  Monitor yourself for signs and symptoms of illness Caregivers and household members are considered close contacts, should monitor their health, and will be asked to limit movement outside of the home to the extent possible. Follow the monitoring steps for close contacts listed on the symptom monitoring form.   ? If you have additional questions, contact your local health department or call the epidemiologist on call at [phone] (available 24/7). ? This guidance is subject to change. For the most up-to-date guidance from [facility], please refer to their website: [email or web address]

## 2020-07-29 NOTE — Progress Notes (Signed)
Virtual Visit via Video Note  I connected with Georgia Baria on 07/29/20 at  3:40 PM EST by a video enabled telemedicine application and verified that I am speaking with the correct person using two identifiers.  Location: Patient: car Provider: [facility]   I discussed the limitations of evaluation and management by telemedicine and the availability of in person appointments. The patient expressed understanding and agreed to proceed.  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 Did at home COVID test, negative yesterday.  - has received 3 doses of Moderna vaccine  Worst symptom: sore throat, runny nose, sneezing, congestion Fever: no Cough: no Shortness of breath: no Wheezing: no Chest pain: no Chest tightness: no Chest congestion: no Nasal congestion: yes Runny nose: yes Sneezing: yes Sore throat: yes Sinus pressure: yes Headache: yes Ear pain: no  Ear pressure: no  Eyes red/itching:no Eye drainage/crusting: no  Vomiting: no Rash: no Sick contacts: no Context: worse Recurrent sinusitis: no Relief with OTC cold/cough medications: yes  Treatments attempted: ibuprofen and cold/sinus     Observations/Objective:  Patient had trouble connecting to video visit, entirety of visit conducted over the phone.  Speaks in full sentences, no respiratory distress.   Assessment and Plan:  Viral URI With mild sx. Will test for COVID here, would be candidate for MAB referral if positive. Reviewed OTC symptom relief, self-quarantine, emergency precautions. Note provided for work.    I discussed the assessment and treatment plan with the patient. The patient was provided an opportunity to ask questions and all were answered. The patient agreed with the plan and demonstrated an understanding of the instructions.   The patient was advised to call back or seek an in-person evaluation if the symptoms  worsen or if the condition fails to improve as anticipated.  I provided 7 minutes of non-face-to-face time during this encounter.   Caro Laroche, DO

## 2020-07-29 NOTE — Telephone Encounter (Signed)
Please get patient scheduled.  °

## 2020-07-29 NOTE — Assessment & Plan Note (Addendum)
With mild sx. Will test for COVID here, would be candidate for MAB referral if positive. Reviewed OTC symptom relief, self-quarantine, emergency precautions. Note provided for work.

## 2020-07-30 ENCOUNTER — Other Ambulatory Visit: Payer: BC Managed Care – PPO

## 2020-07-31 LAB — NOVEL CORONAVIRUS, NAA: SARS-CoV-2, NAA: NOT DETECTED

## 2020-07-31 LAB — SARS-COV-2, NAA 2 DAY TAT

## 2020-09-01 ENCOUNTER — Other Ambulatory Visit: Payer: Self-pay | Admitting: Family Medicine

## 2020-09-08 ENCOUNTER — Other Ambulatory Visit: Payer: Self-pay

## 2020-09-08 ENCOUNTER — Encounter: Payer: Self-pay | Admitting: Family Medicine

## 2020-09-08 ENCOUNTER — Ambulatory Visit (INDEPENDENT_AMBULATORY_CARE_PROVIDER_SITE_OTHER): Payer: BC Managed Care – PPO | Admitting: Family Medicine

## 2020-09-08 VITALS — BP 133/88 | HR 71 | Ht 65.5 in | Wt 203.0 lb

## 2020-09-08 DIAGNOSIS — Z0000 Encounter for general adult medical examination without abnormal findings: Secondary | ICD-10-CM | POA: Diagnosis not present

## 2020-09-08 DIAGNOSIS — Z1211 Encounter for screening for malignant neoplasm of colon: Secondary | ICD-10-CM | POA: Diagnosis not present

## 2020-09-08 DIAGNOSIS — Z23 Encounter for immunization: Secondary | ICD-10-CM

## 2020-09-08 DIAGNOSIS — E782 Mixed hyperlipidemia: Secondary | ICD-10-CM

## 2020-09-08 LAB — URINALYSIS, ROUTINE W REFLEX MICROSCOPIC
Bilirubin, UA: NEGATIVE
Glucose, UA: NEGATIVE
Ketones, UA: NEGATIVE
Leukocytes,UA: NEGATIVE
Nitrite, UA: NEGATIVE
Protein,UA: NEGATIVE
RBC, UA: NEGATIVE
Specific Gravity, UA: >1.03 — ABNORMAL HIGH (ref 1.005–1.030)
Urobilinogen, Ur: 0.2 mg/dL (ref 0.2–1.0)
pH, UA: 5 (ref 5.0–7.5)

## 2020-09-08 NOTE — Progress Notes (Signed)
BP 133/88   Pulse 71   Ht 5' 5.5" (1.664 m)   Wt 203 lb (92.1 kg)   SpO2 98%   BMI 33.27 kg/m    Subjective:    Patient ID: Angel Montes, male    DOB: 28-Jul-1975, 45 y.o.   MRN: [id number]  HPI: Angel Montes is a 45 y.o. male presenting on 09/08/2020 for comprehensive medical examination. Current medical complaints include:  HYPERLIPIDEMIA Hyperlipidemia status: excellent compliance Satisfied with current treatment?  yes Side effects:  no Medication compliance: excellent compliance Past cholesterol meds: atorvastatin Supplements: none Aspirin:  no The ASCVD Risk score Mikey Bussing DC Jr., et al., 2013) failed to calculate for the following reasons:   The valid total cholesterol range is 130 to 320 mg/dL Chest pain:  no Coronary artery disease:  no  Interim Problems from his last visit: no  Depression Screen done today and results listed below:  Depression screen [facility] 2/9 09/08/2020 08/13/2019 03/20/2019 06/07/2018 01/05/2017  Decreased Interest 0 0 0 0 0  Down, Depressed, Hopeless 0 0 0 0 0  PHQ - 2 Score 0 0 0 0 0  Altered sleeping - 0 - 1 -  Tired, decreased energy - 0 - 0 -  Change in appetite - 0 - 0 -  Feeling bad or failure about yourself  - 0 - 0 -  Trouble concentrating - 0 - 0 -  Moving slowly or fidgety/restless - 0 - 0 -  Suicidal thoughts - 0 - 0 -  PHQ-9 Score - 0 - 1 -  Difficult doing work/chores - Not difficult at all - - -    Past Medical History:  Past Medical History:  Diagnosis Date  . Asthma   . Hyperlipidemia   . Hypertension   . Sciatica     Surgical History:  Past Surgical History:  Procedure Laterality Date  . CHOLECYSTECTOMY      Medications:  Current Outpatient Medications on File Prior to Visit  Medication Sig  . atorvastatin (LIPITOR) 80 MG tablet TAKE 1 TABLET(80 MG) BY MOUTH DAILY   No current facility-administered medications on file prior to visit.    Allergies:  No Known Allergies  Social History:  Social History    Socioeconomic History  . Marital status: Married    Spouse name: Not on file  . Number of children: Not on file  . Years of education: Not on file  . Highest education level: Not on file  Occupational History  . Not on file  Tobacco Use  . Smoking status: Never Smoker  . Smokeless tobacco: Never Used  Vaping Use  . Vaping Use: Never used  Substance and Sexual Activity  . Alcohol use: Yes    Comment: On occasion/ socially  . Drug use: No  . Sexual activity: Yes  Other Topics Concern  . Not on file  Social History Narrative  . Not on file   Social Determinants of Health   Financial Resource Strain: Not on file  Food Insecurity: Not on file  Transportation Needs: Not on file  Physical Activity: Not on file  Stress: Not on file  Social Connections: Not on file  Intimate Partner Violence: Not on file   Social History   Tobacco Use  Smoking Status Never Smoker  Smokeless Tobacco Never Used   Social History   Substance and Sexual Activity  Alcohol Use Yes   Comment: On occasion/ socially    Family History:  Family History  Problem Relation Age  of Onset  . Hypertension Mother     Past medical history, surgical history, medications, allergies, family history and social history reviewed with patient today and changes made to appropriate areas of the chart.   Review of Systems  Constitutional: Negative.   HENT: Negative.   Eyes: Negative.   Respiratory: Negative.   Cardiovascular: Negative.   Gastrointestinal: Negative.   Genitourinary: Negative.   Musculoskeletal: Negative.   Skin: Negative.   Neurological: Negative.   Endo/Heme/Allergies: Negative.   Psychiatric/Behavioral: Negative.    All other ROS negative except what is listed above and in the HPI.      Objective:    BP 133/88   Pulse 71   Ht 5' 5.5" (1.664 m)   Wt 203 lb (92.1 kg)   SpO2 98%   BMI 33.27 kg/m   Wt Readings from Last 3 Encounters:  09/08/20 203 lb (92.1 kg)  03/04/20 199  lb 12.8 oz (90.6 kg)  08/13/19 199 lb 3.2 oz (90.4 kg)    Physical Exam Vitals and nursing note reviewed.  Constitutional:      General: He is not in acute distress.    Appearance: Normal appearance. He is obese. He is not ill-appearing, toxic-appearing or diaphoretic.  HENT:     Head: Normocephalic and atraumatic.     Right Ear: Tympanic membrane, ear canal and external ear normal. There is no impacted cerumen.     Left Ear: Tympanic membrane, ear canal and external ear normal. There is no impacted cerumen.     Nose: Nose normal. No congestion or rhinorrhea.     Mouth/Throat:     Mouth: Mucous membranes are moist.     Pharynx: Oropharynx is clear. No oropharyngeal exudate or posterior oropharyngeal erythema.  Eyes:     General: No scleral icterus.       Right eye: No discharge.        Left eye: No discharge.     Extraocular Movements: Extraocular movements intact.     Conjunctiva/sclera: Conjunctivae normal.     Pupils: Pupils are equal, round, and reactive to light.  Neck:     Vascular: No carotid bruit.  Cardiovascular:     Rate and Rhythm: Normal rate and regular rhythm.     Pulses: Normal pulses.     Heart sounds: No murmur heard. No friction rub. No gallop.   Pulmonary:     Effort: Pulmonary effort is normal. No respiratory distress.     Breath sounds: Normal breath sounds. No stridor. No wheezing, rhonchi or rales.  Chest:     Chest wall: No tenderness.  Abdominal:     General: Abdomen is flat. Bowel sounds are normal. There is no distension.     Palpations: Abdomen is soft. There is no mass.     Tenderness: There is no abdominal tenderness. There is no right CVA tenderness, left CVA tenderness, guarding or rebound.     Hernia: No hernia is present.  Genitourinary:    Comments: Genital exam deferred with shared decision making Musculoskeletal:        General: No swelling, tenderness, deformity or signs of injury.     Cervical back: Normal range of motion and neck  supple. No rigidity. No muscular tenderness.     Right lower leg: No edema.     Left lower leg: No edema.  Lymphadenopathy:     Cervical: No cervical adenopathy.  Skin:    General: Skin is warm and dry.     Capillary  Refill: Capillary refill takes less than 2 seconds.     Coloration: Skin is not jaundiced or pale.     Findings: No bruising, erythema, lesion or rash.  Neurological:     General: No focal deficit present.     Mental Status: He is alert and oriented to person, place, and time.     Cranial Nerves: No cranial nerve deficit.     Sensory: No sensory deficit.     Motor: No weakness.     Coordination: Coordination normal.     Gait: Gait normal.     Deep Tendon Reflexes: Reflexes normal.  Psychiatric:        Mood and Affect: Mood normal.        Behavior: Behavior normal.        Thought Content: Thought content normal.        Judgment: Judgment normal.     Results for orders placed or performed in visit on 07/29/20  Novel Coronavirus, NAA ([facility])   Specimen: Nasopharyngeal(NP) swabs in vial transport medium  Result Value Ref Range   SARS-CoV-2, NAA Not Detected Not Detected  SARS-COV-2, NAA 2 DAY TAT  Result Value Ref Range   SARS-CoV-2, NAA 2 DAY TAT Performed       Assessment & Plan:   Problem List Items Addressed This Visit      Other   Hyperlipidemia    Under good control on current regimen. Continue current regimen. Continue to monitor. Call with any concerns. Refills given. Labs drawn today.       Relevant Orders   Lipid Panel w/o Chol/HDL Ratio    Other Visit Diagnoses    Routine general medical examination at a health care facility    -  Primary   Vaccines up to date. Screening labs checked today. Colonoscopy ordered. Continue diet and exercise. Call with any concerns.    Relevant Orders   Comprehensive metabolic panel   CBC with Differential/Platelet   Lipid Panel w/o Chol/HDL Ratio   TSH   Urinalysis, Routine w reflex microscopic   Screening  for colon cancer       Referral to GI placed today. Await their input.    Relevant Orders   Ambulatory referral to Gastroenterology       LABORATORY TESTING:  Health maintenance labs ordered today as discussed above.   IMMUNIZATIONS:   - Tdap: Tetanus vaccination status reviewed: Tdap vaccination indicated and given today. - Influenza: Administered today - Pneumovax: Not applicable - COVID: Up to date  SCREENING: - Colonoscopy: Ordered today  Discussed with patient purpose of the colonoscopy is to detect colon cancer at curable precancerous or early stages   PATIENT COUNSELING:    Sexuality: Discussed sexually transmitted diseases, partner selection, use of condoms, avoidance of unintended pregnancy  and contraceptive alternatives.   Advised to avoid cigarette smoking.  I discussed with the patient that most people either abstain from alcohol or drink within safe limits (<=14/week and <=4 drinks/occasion for males, <=7/weeks and <= 3 drinks/occasion for females) and that the risk for alcohol disorders and other health effects rises proportionally with the number of drinks per week and how often a drinker exceeds daily limits.  Discussed cessation/primary prevention of drug use and availability of treatment for abuse.   Diet: Encouraged to adjust caloric intake to maintain  or achieve ideal body weight, to reduce intake of dietary saturated fat and total fat, to limit sodium intake by avoiding high sodium foods and not adding table salt,  and to maintain adequate dietary potassium and calcium preferably from fresh fruits, vegetables, and low-fat dairy products.    stressed the importance of regular exercise  Injury prevention: Discussed safety belts, safety helmets, smoke detector, smoking near bedding or upholstery.   Dental health: Discussed importance of regular tooth brushing, flossing, and dental visits.   Follow up plan: NEXT PREVENTATIVE PHYSICAL DUE IN 1 YEAR. Return in  about 6 months (around 03/08/2021).

## 2020-09-08 NOTE — Assessment & Plan Note (Signed)
Under good control on current regimen. Continue current regimen. Continue to monitor. Call with any concerns. Refills given. Labs drawn today.   

## 2020-09-08 NOTE — Patient Instructions (Addendum)
Health Maintenance, Male Adopting a healthy lifestyle and getting preventive care are important in promoting health and wellness. Ask your health care provider about:  The right schedule for you to have regular tests and exams.  Things you can do on your own to prevent diseases and keep yourself healthy. What should I know about diet, weight, and exercise? Eat a healthy diet  Eat a diet that includes plenty of vegetables, fruits, low-fat dairy products, and lean protein.  Do not eat a lot of foods that are high in solid fats, added sugars, or sodium.   Maintain a healthy weight Body mass index (BMI) is a measurement that can be used to identify possible weight problems. It estimates body fat based on height and weight. Your health care provider can help determine your BMI and help you achieve or maintain a healthy weight. Get regular exercise Get regular exercise. This is one of the most important things you can do for your health. Most adults should:  Exercise for at least 150 minutes each week. The exercise should increase your heart rate and make you sweat (moderate-intensity exercise).  Do strengthening exercises at least twice a week. This is in addition to the moderate-intensity exercise.  Spend less time sitting. Even light physical activity can be beneficial. Watch cholesterol and blood lipids Have your blood tested for lipids and cholesterol at 47 years of age, then have this test every 5 years. You may need to have your cholesterol levels checked more often if:  Your lipid or cholesterol levels are high.  You are older than 47 years of age.  You are at high risk for heart disease. What should I know about cancer screening? Many types of cancers can be detected early and may often be prevented. Depending on your health history and family history, you may need to have cancer screening at various ages. This may include screening for:  Colorectal cancer.  Prostate  cancer.  Skin cancer.  Lung cancer. What should I know about heart disease, diabetes, and high blood pressure? Blood pressure and heart disease  High blood pressure causes heart disease and increases the risk of stroke. This is more likely to develop in people who have high blood pressure readings, are of African descent, or are overweight.  Talk with your health care provider about your target blood pressure readings.  Have your blood pressure checked: ? Every 3-5 years if you are 18-39 years of age. ? Every year if you are 40 years old or older.  If you are between the ages of 65 and 75 and are a current or former smoker, ask your health care provider if you should have a one-time screening for abdominal aortic aneurysm (AAA). Diabetes Have regular diabetes screenings. This checks your fasting blood sugar level. Have the screening done:  Once every three years after age 45 if you are at a normal weight and have a low risk for diabetes.  More often and at a younger age if you are overweight or have a high risk for diabetes. What should I know about preventing infection? Hepatitis B If you have a higher risk for hepatitis B, you should be screened for this virus. Talk with your health care provider to find out if you are at risk for hepatitis B infection. Hepatitis C Blood testing is recommended for:  Everyone born from 1945 through 1965.  Anyone with known risk factors for hepatitis C. Sexually transmitted infections (STIs)  You should be screened each   year for STIs, including gonorrhea and chlamydia, if: ? You are sexually active and are younger than 47 years of age. ? You are older than 47 years of age and your health care provider tells you that you are at risk for this type of infection. ? Your sexual activity has changed since you were last screened, and you are at increased risk for chlamydia or gonorrhea. Ask your health care provider if you are at risk.  Ask your  health care provider about whether you are at high risk for HIV. Your health care provider may recommend a prescription medicine to help prevent HIV infection. If you choose to take medicine to prevent HIV, you should first get tested for HIV. You should then be tested every 3 months for as long as you are taking the medicine. Follow these instructions at home: Lifestyle  Do not use any products that contain nicotine or tobacco, such as cigarettes, e-cigarettes, and chewing tobacco. If you need help quitting, ask your health care provider.  Do not use street drugs.  Do not share needles.  Ask your health care provider for help if you need support or information about quitting drugs. Alcohol use  Do not drink alcohol if your health care provider tells you not to drink.  If you drink alcohol: ? Limit how much you have to 0-2 drinks a day. ? Be aware of how much alcohol is in your drink. In the [place] one drink equals one 12 oz bottle of beer (355 mL), one 5 oz glass of wine (148 mL), or one 1 oz glass of hard liquor (44 mL). General instructions  Schedule regular health, dental, and eye exams.  Stay current with your vaccines.  Tell your health care provider if: ? You often feel depressed. ? You have ever been abused or do not feel safe at home. Summary  Adopting a healthy lifestyle and getting preventive care are important in promoting health and wellness.  Follow your health care provider's instructions about healthy diet, exercising, and getting tested or screened for diseases.  Follow your health care provider's instructions on monitoring your cholesterol and blood pressure. This information is not intended to replace advice given to you by your health care provider. Make sure you discuss any questions you have with your health care provider. Document Revised: 07/04/2018 Document Reviewed: 07/04/2018 Elsevier Patient Education  2021 [employer]. Tdap (Tetanus, Diphtheria,  Pertussis) Vaccine: What You Need to Know 1. Why get vaccinated? Tdap vaccine can prevent tetanus, diphtheria, and pertussis. Diphtheria and pertussis spread from person to person. Tetanus enters the body through cuts or wounds.  TETANUS (T) causes painful stiffening of the muscles. Tetanus can lead to serious health problems, including being unable to open the mouth, having trouble swallowing and breathing, or death.  DIPHTHERIA (D) can lead to difficulty breathing, heart failure, paralysis, or death.  PERTUSSIS (aP), also known as "whooping cough," can cause uncontrollable, violent coughing that makes it hard to breathe, eat, or drink. Pertussis can be extremely serious especially in babies and young children, causing pneumonia, convulsions, brain damage, or death. In teens and adults, it can cause weight loss, loss of bladder control, passing out, and rib fractures from severe coughing. 2. Tdap vaccine Tdap is only for children 7 years and older, adolescents, and adults.  Adolescents should receive a single dose of Tdap, preferably at age 69 or 6 years. Pregnant people should get a dose of Tdap during every pregnancy, preferably during the early  part of the third trimester, to help protect the newborn from pertussis. Infants are most at risk for severe, life-threatening complications from pertussis. Adults who have never received Tdap should get a dose of Tdap. Also, adults should receive a booster dose of either Tdap or Td (a different vaccine that protects against tetanus and diphtheria but not pertussis) every 10 years, or after 5 years in the case of a severe or dirty wound or burn. Tdap may be given at the same time as other vaccines. 3. Talk with your health care provider Tell your vaccine provider if the person getting the vaccine:  Has had an allergic reaction after a previous dose of any vaccine that protects against tetanus, diphtheria, or pertussis, or has any severe,  life-threatening allergies  Has had a coma, decreased level of consciousness, or prolonged seizures within 7 days after a previous dose of any pertussis vaccine (DTP, DTaP, or Tdap)  Has seizures or another nervous system problem  Has ever had Guillain-Barr Syndrome (also called "GBS")  Has had severe pain or swelling after a previous dose of any vaccine that protects against tetanus or diphtheria In some cases, your health care provider may decide to postpone Tdap vaccination until a future visit. People with minor illnesses, such as a cold, may be vaccinated. People who are moderately or severely ill should usually wait until they recover before getting Tdap vaccine.  Your health care provider can give you more information. 4. Risks of a vaccine reaction  Pain, redness, or swelling where the shot was given, mild fever, headache, feeling tired, and nausea, vomiting, diarrhea, or stomachache sometimes happen after Tdap vaccination. People sometimes faint after medical procedures, including vaccination. Tell your provider if you feel dizzy or have vision changes or ringing in the ears.  As with any medicine, there is a very remote chance of a vaccine causing a severe allergic reaction, other serious injury, or death. 5. What if there is a serious problem? An allergic reaction could occur after the vaccinated person leaves the clinic. If you see signs of a severe allergic reaction (hives, swelling of the face and throat, difficulty breathing, a fast heartbeat, dizziness, or weakness), call 9-1-1 and get the person to the nearest hospital. For other signs that concern you, call your health care provider.  Adverse reactions should be reported to the Vaccine Adverse Event Reporting System (VAERS). Your health care provider will usually file this report, or you can do it yourself. Visit the VAERS website at www.vaers.SamedayNews.es or call 760-709-1528. VAERS is only for reporting reactions, and VAERS  staff members do not give medical advice. 6. The National Vaccine Injury Compensation Program The [employer] Vaccine Injury Compensation Program (VICP) is a federal program that was created to compensate people who may have been injured by certain vaccines. Claims regarding alleged injury or death due to vaccination have a time limit for filing, which may be as short as two years. Visit the VICP website at GoldCloset.com.ee or call 9348270731 to learn about the program and about filing a claim. 7. How can I learn more?  Ask your health care provider.  Call your local or state health department.  Visit the website of the Food and Drug Administration (FDA) for vaccine package inserts and additional information at TraderRating.uy.  Contact the Centers for Disease Control and Prevention (CDC): ? Call 680-629-8479 (1-800-CDC-INFO) or ? Visit CDC's website at http://hunter.com/. Vaccine Information Statement Tdap (Tetanus, Diphtheria, Pertussis) Vaccine (02/28/2020) This information is not intended to  replace advice given to you by your health care provider. Make sure you discuss any questions you have with your health care provider. Document Revised: 03/25/2020 Document Reviewed: 03/25/2020 Elsevier Patient Education  [place]. Influenza (Flu) Vaccine (Inactivated or Recombinant): What You Need to Know 1. Why get vaccinated? Influenza vaccine can prevent influenza (flu). Flu is a contagious disease that spreads around the [place] every year, usually between October and May. Anyone can get the flu, but it is more dangerous for some people. Infants and young children, people 67 years and older, pregnant people, and people with certain health conditions or a weakened immune system are at greatest risk of flu complications. Pneumonia, bronchitis, sinus infections, and ear infections are examples of flu-related complications. If you have  a medical condition, such as heart disease, cancer, or diabetes, flu can make it worse. Flu can cause fever and chills, sore throat, muscle aches, fatigue, cough, headache, and runny or stuffy nose. Some people may have vomiting and diarrhea, though this is more common in children than adults. In an average year, thousands of people in the [place] die from flu, and many more are hospitalized. Flu vaccine prevents millions of illnesses and flu-related visits to the doctor each year. 2. Influenza vaccines CDC recommends everyone 6 months and older get vaccinated every flu season. Children 6 months through 55 years of age may need 2 doses during a single flu season. Everyone else needs only 1 dose each flu season. It takes about 2 weeks for protection to develop after vaccination. There are many flu viruses, and they are always changing. Each year a new flu vaccine is made to protect against the influenza viruses believed to be likely to cause disease in the upcoming flu season. Even when the vaccine doesn't exactly match these viruses, it may still provide some protection. Influenza vaccine does not cause flu. Influenza vaccine may be given at the same time as other vaccines. 3. Talk with your health care provider Tell your vaccination provider if the person getting the vaccine:  Has had an allergic reaction after a previous dose of influenza vaccine, or has any severe, life-threatening allergies  Has ever had Guillain-Barr Syndrome (also called "GBS") In some cases, your health care provider may decide to postpone influenza vaccination until a future visit. Influenza vaccine can be administered at any time during pregnancy. People who are or will be pregnant during influenza season should receive inactivated influenza vaccine. People with minor illnesses, such as a cold, may be vaccinated. People who are moderately or severely ill should usually wait until they recover before getting influenza  vaccine. Your health care provider can give you more information. 4. Risks of a vaccine reaction  Soreness, redness, and swelling where the shot is given, fever, muscle aches, and headache can happen after influenza vaccination.  There may be a very small increased risk of Guillain-Barr Syndrome (GBS) after inactivated influenza vaccine (the flu shot). Young children who get the flu shot along with pneumococcal vaccine (PCV13) and/or DTaP vaccine at the same time might be slightly more likely to have a seizure caused by fever. Tell your health care provider if a child who is getting flu vaccine has ever had a seizure. People sometimes faint after medical procedures, including vaccination. Tell your provider if you feel dizzy or have vision changes or ringing in the ears. As with any medicine, there is a very remote chance of a vaccine causing a severe allergic reaction, other serious  injury, or death. 5. What if there is a serious problem? An allergic reaction could occur after the vaccinated person leaves the clinic. If you see signs of a severe allergic reaction (hives, swelling of the face and throat, difficulty breathing, a fast heartbeat, dizziness, or weakness), call 9-1-1 and get the person to the nearest hospital. For other signs that concern you, call your health care provider. Adverse reactions should be reported to the Vaccine Adverse Event Reporting System (VAERS). Your health care provider will usually file this report, or you can do it yourself. Visit the VAERS website at www.vaers.SamedayNews.es or call (930)077-0141. VAERS is only for reporting reactions, and VAERS staff members do not give medical advice. 6. The National Vaccine Injury Compensation Program The [employer] Vaccine Injury Compensation Program (VICP) is a federal program that was created to compensate people who may have been injured by certain vaccines. Claims regarding alleged injury or death due to vaccination have a time  limit for filing, which may be as short as two years. Visit the VICP website at GoldCloset.com.ee or call 340 511 9861 to learn about the program and about filing a claim. 7. How can I learn more?  Ask your health care provider.  Call your local or state health department.  Visit the website of the Food and Drug Administration (FDA) for vaccine package inserts and additional information at TraderRating.uy.  Contact the Centers for Disease Control and Prevention (CDC): ? Call (816) 818-4728 (1-800-CDC-INFO) or ? Visit CDC's website at https://gibson.com/. Vaccine Information Statement Inactivated Influenza Vaccine (02/28/2020) This information is not intended to replace advice given to you by your health care provider. Make sure you discuss any questions you have with your health care provider. Document Revised: 04/16/2020 Document Reviewed: 04/16/2020 Elsevier Patient Education  2021 [employer].

## 2020-09-16 ENCOUNTER — Other Ambulatory Visit: Payer: Self-pay

## 2020-09-16 ENCOUNTER — Telehealth (INDEPENDENT_AMBULATORY_CARE_PROVIDER_SITE_OTHER): Payer: Self-pay | Admitting: Gastroenterology

## 2020-09-16 DIAGNOSIS — Z1211 Encounter for screening for malignant neoplasm of colon: Secondary | ICD-10-CM

## 2020-09-16 MED ORDER — CLENPIQ 10-3.5-12 MG-GM -GM/160ML PO SOLN: 320.0000 mL | Freq: Once | ORAL | 0 refills | Status: AC

## 2020-09-16 NOTE — Progress Notes (Signed)
Gastroenterology Pre-Procedure Review  Request Date: 10/18/2020 Requesting Physician: Dr. Bonna Gains  PATIENT REVIEW QUESTIONS: The patient responded to the following health history questions as indicated:    1. Are you having any GI issues? no 2. Do you have a personal history of Polyps? no 3. Do you have a family history of Colon Cancer or Polyps? no 4. Diabetes Mellitus? no 5. Joint replacements in the past 12 months?no 6. Major health problems in the past 3 months?no 7. Any artificial heart valves, MVP, or defibrillator?no    MEDICATIONS & ALLERGIES:    Patient reports the following regarding taking any anticoagulation/antiplatelet therapy:   Plavix, Coumadin, Eliquis, Xarelto, Lovenox, Pradaxa, Brilinta, or Effient? no Aspirin? no  Patient confirms/reports the following medications:  Current Outpatient Medications  Medication Sig Dispense Refill  . atorvastatin (LIPITOR) 80 MG tablet TAKE 1 TABLET(80 MG) BY MOUTH DAILY 90 tablet 1   No current facility-administered medications for this visit.    Patient confirms/reports the following allergies:  No Known Allergies  No orders of the defined types were placed in this encounter.   AUTHORIZATION INFORMATION Primary Insurance: 1D#: Group #:  Secondary Insurance: 1D#: Group #:  SCHEDULE INFORMATION: Date:  Time: Location:

## 2020-09-18 ENCOUNTER — Other Ambulatory Visit: Payer: Self-pay | Admitting: Family Medicine

## 2020-09-18 LAB — CBC WITH DIFFERENTIAL/PLATELET
Basophils Absolute: 0 10*3/uL (ref 0.0–0.2)
Basos: 1 %
EOS (ABSOLUTE): 0.1 10*3/uL (ref 0.0–0.4)
Eos: 1 %
Hematocrit: 43.2 % (ref 37.5–51.0)
Hemoglobin: 14 g/dL (ref 13.0–17.7)
Immature Grans (Abs): 0.1 10*3/uL (ref 0.0–0.1)
Immature Granulocytes: 1 %
Lymphocytes Absolute: 1.8 10*3/uL (ref 0.7–3.1)
Lymphs: 31 %
MCH: 30.4 pg (ref 26.6–33.0)
MCHC: 32.4 g/dL (ref 31.5–35.7)
MCV: 94 fL (ref 79–97)
Monocytes Absolute: 0.4 10*3/uL (ref 0.1–0.9)
Monocytes: 6 %
Neutrophils Absolute: 3.4 10*3/uL (ref 1.4–7.0)
Neutrophils: 60 %
Platelets: 459 10*3/uL — ABNORMAL HIGH (ref 150–450)
RBC: 4.6 x10E6/uL (ref 4.14–5.80)
RDW: 14.3 % (ref 11.6–15.4)
WBC: 5.7 10*3/uL (ref 3.4–10.8)

## 2020-09-18 LAB — COMPREHENSIVE METABOLIC PANEL
ALT: 57 IU/L — ABNORMAL HIGH (ref 0–44)
AST: 43 IU/L — ABNORMAL HIGH (ref 0–40)
Alkaline Phosphatase: 99 IU/L (ref 44–121)
BUN/Creatinine Ratio: 12 (ref 9–20)
BUN: 10 mg/dL (ref 6–24)
Bilirubin Total: 0.4 mg/dL (ref 0.0–1.2)
CO2: 15 mmol/L — ABNORMAL LOW (ref 20–29)
Calcium: 8.3 mg/dL — ABNORMAL LOW (ref 8.7–10.2)
Chloride: 99 mmol/L (ref 96–106)
Creatinine, Ser: 0.83 mg/dL (ref 0.76–1.27)
GFR calc Af Amer: 122 mL/min/{1.73_m2} (ref >59)
GFR calc non Af Amer: 106 mL/min/{1.73_m2} (ref >59)
Glucose: 102 mg/dL — ABNORMAL HIGH (ref 65–99)
Potassium: 3.7 mmol/L (ref 3.5–5.2)
Sodium: 135 mmol/L (ref 134–144)
Total Protein: 6.3 g/dL (ref 6.0–8.5)

## 2020-09-18 LAB — LIPID PANEL W/O CHOL/HDL RATIO
Cholesterol, Total: 266 mg/dL — ABNORMAL HIGH (ref 100–199)
HDL: 13 mg/dL — ABNORMAL LOW (ref >39)
Triglycerides: 3930 mg/dL (ref 0–149)

## 2020-09-18 LAB — TSH: TSH: 2.8 u[IU]/mL (ref 0.450–4.500)

## 2020-09-18 MED ORDER — OMEGA-3-ACID ETHYL ESTERS 1 G PO CAPS: 2.0000 g | ORAL_CAPSULE | Freq: Two times a day (BID) | ORAL | 1 refills | Status: DC

## 2020-09-21 ENCOUNTER — Telehealth: Payer: Self-pay

## 2020-09-21 NOTE — Telephone Encounter (Signed)
PA for [employer] initiated and submitted via Cover My Meds. Key: Allegra Grana

## 2020-09-22 ENCOUNTER — Telehealth: Payer: Self-pay

## 2020-09-22 NOTE — Telephone Encounter (Signed)
PA approved.

## 2020-09-22 NOTE — Telephone Encounter (Signed)
Colonoscopy has been canceled due to insurance not covering until the age of 2. Leigh in Endoscopy has been informed of cancellation.  Thanks,  [place], [place]

## 2020-11-06 ENCOUNTER — Encounter: Payer: Self-pay | Admitting: Family Medicine

## 2020-11-09 MED ORDER — OMEGA-3-ACID ETHYL ESTERS 1 G PO CAPS: 2.0000 g | ORAL_CAPSULE | Freq: Two times a day (BID) | ORAL | 1 refills | Status: DC

## 2020-11-30 ENCOUNTER — Other Ambulatory Visit: Payer: Self-pay | Admitting: Family Medicine

## 2020-11-30 NOTE — Telephone Encounter (Signed)
Requested Prescriptions  Pending Prescriptions Disp Refills  . atorvastatin (LIPITOR) 80 MG tablet [Pharmacy Med Name: ATORVASTATIN 80MG  TABLETS] 90 tablet 1    Sig: TAKE 1 TABLET(80 MG) BY MOUTH DAILY     Cardiovascular:  Antilipid - Statins Failed - 11/30/2020  8:09 AM      Failed - Total Cholesterol in normal range and within 360 days    Cholesterol, Total  Date Value Ref Range Status  09/08/2020 266 (H) 100 - 199 mg/dL Final         Failed - LDL in normal range and within 360 days    LDL Chol Calc (NIH)  Date Value Ref Range Status  09/08/2020 Comment (A) 0 - 99 mg/dL Final    Comment:    Triglyceride result indicated is too high for an accurate LDL cholesterol estimation.          Failed - HDL in normal range and within 360 days    HDL  Date Value Ref Range Status  09/08/2020 13 (L) >39 mg/dL Final         Failed - Triglycerides in normal range and within 360 days    Triglycerides  Date Value Ref Range Status  09/08/2020 3,930 (HH) 0 - 149 mg/dL Final    Comment:    Results confirmed on dilution.          Passed - Patient is not pregnant      Passed - Valid encounter within last 12 months    Recent Outpatient Visits          2 months ago Routine general medical examination at a health care facility   [place], [place], DO   4 months ago Viral URI   [facility] Myles Gip, DO   9 months ago Essential hypertension   [place], [place], DO   1 year ago Routine general medical examination at a health care facility   [place], [place], DO   1 year ago Mixed hyperlipidemia   [facility] Valerie Roys, DO      Future Appointments            In 3 months Wynetta Emery, Barb Merino, DO [facility], [place]

## 2020-12-05 ENCOUNTER — Encounter: Payer: Self-pay | Admitting: Family Medicine

## 2020-12-18 ENCOUNTER — Ambulatory Visit: Admit: 2020-12-18 | Payer: BC Managed Care – PPO | Admitting: Gastroenterology

## 2020-12-18 SURGERY — COLONOSCOPY WITH PROPOFOL
Anesthesia: General

## 2020-12-28 ENCOUNTER — Ambulatory Visit: Payer: BC Managed Care – PPO | Admitting: Family Medicine

## 2020-12-28 ENCOUNTER — Other Ambulatory Visit: Payer: Self-pay

## 2020-12-28 ENCOUNTER — Encounter: Payer: Self-pay | Admitting: Family Medicine

## 2020-12-28 VITALS — BP 125/78 | HR 59 | Temp 98.2°F | Wt 190.4 lb

## 2020-12-28 DIAGNOSIS — E782 Mixed hyperlipidemia: Secondary | ICD-10-CM

## 2020-12-28 DIAGNOSIS — S6010XA Contusion of unspecified finger with damage to nail, initial encounter: Secondary | ICD-10-CM | POA: Diagnosis not present

## 2020-12-28 NOTE — Progress Notes (Signed)
BP 125/78   Pulse (!) 59   Temp 98.2 F (36.8 C)   Wt 190 lb 6.4 oz (86.4 kg)   SpO2 98%   BMI 31.20 kg/m    Subjective:    Patient ID: Angel Montes, male    DOB: [birth year], 45 y.o.   MRN: [id number]  HPI: Angel Montes is a 45 y.o. male  Chief Complaint  Patient presents with  . thumb injury    Patient states about 3 weeks ago he injured his thumb, would like to know if there is anything he should do about it    HAND PAIN Duration: 3 weeks Involved hand: right Mechanism of injury: trauma Location: r thumb Onset: sudden Severity: no pain  Quality: no pain Frequency: no pain Radiation: no Relief with NSAIDs?: no Weakness: no Numbness: no Redness: no Swelling:no Bruising: no Fevers: no  HYPERLIPIDEMIA Hyperlipidemia status: excellent compliance Satisfied with current treatment?  yes Side effects:  no Medication compliance: excellent compliance Past cholesterol meds: atorvastatin and lovaza Supplements: fish oil Aspirin:  no The ASCVD Risk score Mikey Bussing DC Jr., et al., 2013) failed to calculate for the following reasons:   The valid HDL cholesterol range is 20 to 100 mg/dL Chest pain:  no  Relevant past medical, surgical, family and social history reviewed and updated as indicated. Interim medical history since our last visit reviewed. Allergies and medications reviewed and updated.  Review of Systems  Constitutional: Negative.   Respiratory: Negative.   Cardiovascular: Negative.   Gastrointestinal: Negative.   Musculoskeletal: Negative.   Neurological: Negative.   Psychiatric/Behavioral: Negative.     Per HPI unless specifically indicated above     Objective:    BP 125/78   Pulse (!) 59   Temp 98.2 F (36.8 C)   Wt 190 lb 6.4 oz (86.4 kg)   SpO2 98%   BMI 31.20 kg/m   Wt Readings from Last 3 Encounters:  12/28/20 190 lb 6.4 oz (86.4 kg)  09/08/20 203 lb (92.1 kg)  03/04/20 199 lb 12.8 oz (90.6 kg)    Physical Exam Vitals and nursing  note reviewed.  Constitutional:      General: He is not in acute distress.    Appearance: Normal appearance. He is not ill-appearing, toxic-appearing or diaphoretic.  HENT:     Head: Normocephalic and atraumatic.     Right Ear: External ear normal.     Left Ear: External ear normal.     Nose: Nose normal.     Mouth/Throat:     Mouth: Mucous membranes are moist.     Pharynx: Oropharynx is clear.  Eyes:     General: No scleral icterus.       Right eye: No discharge.        Left eye: No discharge.     Extraocular Movements: Extraocular movements intact.     Conjunctiva/sclera: Conjunctivae normal.     Pupils: Pupils are equal, round, and reactive to light.  Cardiovascular:     Rate and Rhythm: Normal rate and regular rhythm.     Pulses: Normal pulses.     Heart sounds: Normal heart sounds. No murmur heard. No friction rub. No gallop.   Pulmonary:     Effort: Pulmonary effort is normal. No respiratory distress.     Breath sounds: Normal breath sounds. No stridor. No wheezing, rhonchi or rales.  Chest:     Chest wall: No tenderness.  Musculoskeletal:        General: Normal range of  motion.     Cervical back: Normal range of motion and neck supple.  Skin:    General: Skin is warm and dry.     Capillary Refill: Capillary refill takes less than 2 seconds.     Coloration: Skin is not jaundiced or pale.     Findings: No bruising, erythema, lesion or rash.     Comments: Contusion under nail of thumb of R hand  Neurological:     General: No focal deficit present.     Mental Status: He is alert and oriented to person, place, and time. Mental status is at baseline.  Psychiatric:        Mood and Affect: Mood normal.        Behavior: Behavior normal.        Thought Content: Thought content normal.        Judgment: Judgment normal.     Results for orders placed or performed in visit on 09/08/20  Comprehensive metabolic panel  Result Value Ref Range   Glucose 102 (H) 65 - 99 mg/dL    BUN 10 6 - 24 mg/dL   Creatinine, Ser 0.83 0.76 - 1.27 mg/dL   GFR calc non Af Amer 106 >59 mL/min/1.73   GFR calc Af Amer 122 >59 mL/min/1.73   BUN/Creatinine Ratio 12 9 - 20   Sodium 135 134 - 144 mmol/L   Potassium 3.7 3.5 - 5.2 mmol/L   Chloride 99 96 - 106 mmol/L   CO2 15 (L) 20 - 29 mmol/L   Calcium 8.3 (L) 8.7 - 10.2 mg/dL   Total Protein 6.3 6.0 - 8.5 g/dL   Albumin CANCELED g/dL   Globulin, Total CANCELED g/dL   Albumin/Globulin Ratio CANCELED    Bilirubin Total 0.4 0.0 - 1.2 mg/dL   Alkaline Phosphatase 99 44 - 121 IU/L   AST 43 (H) 0 - 40 IU/L   ALT 57 (H) 0 - 44 IU/L  CBC with Differential/Platelet  Result Value Ref Range   WBC 5.7 3.4 - 10.8 x10E3/uL   RBC 4.60 4.14 - 5.80 x10E6/uL   Hemoglobin 14.0 13.0 - 17.7 g/dL   Hematocrit 43.2 37.5 - 51.0 %   MCV 94 79 - 97 fL   MCH 30.4 26.6 - 33.0 pg   MCHC 32.4 31.5 - 35.7 g/dL   RDW 14.3 11.6 - 15.4 %   Platelets 459 (H) 150 - 450 x10E3/uL   Neutrophils 60 Not Estab. %   Lymphs 31 Not Estab. %   Monocytes 6 Not Estab. %   Eos 1 Not Estab. %   Basos 1 Not Estab. %   Neutrophils Absolute 3.4 1.4 - 7.0 x10E3/uL   Lymphocytes Absolute 1.8 0.7 - 3.1 x10E3/uL   Monocytes Absolute 0.4 0.1 - 0.9 x10E3/uL   EOS (ABSOLUTE) 0.1 0.0 - 0.4 x10E3/uL   Basophils Absolute 0.0 0.0 - 0.2 x10E3/uL   Immature Granulocytes 1 Not Estab. %   Immature Grans (Abs) 0.1 0.0 - 0.1 x10E3/uL   Hematology Comments: Note:   Lipid Panel w/o Chol/HDL Ratio  Result Value Ref Range   Cholesterol, Total 266 (H) 100 - 199 mg/dL   Triglycerides 3,930 (HH) 0 - 149 mg/dL   HDL 13 (L) >39 mg/dL   VLDL Cholesterol Cal Comment (A) 5 - 40 mg/dL   LDL Chol Calc (NIH) Comment (A) 0 - 99 mg/dL  TSH  Result Value Ref Range   TSH 2.800 0.450 - 4.500 uIU/mL  Urinalysis, Routine w reflex microscopic  Result Value Ref Range   Specific Gravity, UA >1.030 (H) 1.005 - 1.030   pH, UA 5.0 5.0 - 7.5   Color, UA Yellow Yellow   Appearance Ur Clear Clear    Leukocytes,UA Negative Negative   Protein,UA Negative Negative/Trace   Glucose, UA Negative Negative   Ketones, UA Negative Negative   RBC, UA Negative Negative   Bilirubin, UA Negative Negative   Urobilinogen, Ur 0.2 0.2 - 1.0 mg/dL   Nitrite, UA Negative Negative      Assessment & Plan:   Problem List Items Addressed This Visit      Other   Hyperlipidemia    Tolerating the lovaza well. Feels well. Rechecking labs today. Await results.       Relevant Orders   Comprehensive metabolic panel   Lipid Panel w/o Chol/HDL Ratio    Other Visit Diagnoses    Contusion of fingernail, initial encounter    -  Primary   Reassured patient. No pain. Continue to monitor. Call with any concerns.        Follow up plan: Return if symptoms worsen or fail to improve.

## 2020-12-28 NOTE — Assessment & Plan Note (Signed)
Tolerating the lovaza well. Feels well. Rechecking labs today. Await results.

## 2020-12-29 LAB — LIPID PANEL W/O CHOL/HDL RATIO
Cholesterol, Total: 89 mg/dL — ABNORMAL LOW (ref 100–199)
HDL: 21 mg/dL — ABNORMAL LOW (ref >39)
LDL Chol Calc (NIH): 21 mg/dL (ref 0–99)
Triglycerides: 329 mg/dL — ABNORMAL HIGH (ref 0–149)
VLDL Cholesterol Cal: 47 mg/dL — ABNORMAL HIGH (ref 5–40)

## 2020-12-29 LAB — COMPREHENSIVE METABOLIC PANEL
ALT: 37 IU/L (ref 0–44)
AST: 22 IU/L (ref 0–40)
Albumin/Globulin Ratio: 2.4 — ABNORMAL HIGH (ref 1.2–2.2)
Albumin: 4.3 g/dL (ref 4.0–5.0)
Alkaline Phosphatase: 86 IU/L (ref 44–121)
BUN/Creatinine Ratio: 13 (ref 9–20)
BUN: 14 mg/dL (ref 6–24)
Bilirubin Total: 0.5 mg/dL (ref 0.0–1.2)
CO2: 22 mmol/L (ref 20–29)
Calcium: 8.9 mg/dL (ref 8.7–10.2)
Chloride: 106 mmol/L (ref 96–106)
Creatinine, Ser: 1.11 mg/dL (ref 0.76–1.27)
Globulin, Total: 1.8 g/dL (ref 1.5–4.5)
Glucose: 85 mg/dL (ref 65–99)
Potassium: 3.6 mmol/L (ref 3.5–5.2)
Sodium: 142 mmol/L (ref 134–144)
Total Protein: 6.1 g/dL (ref 6.0–8.5)
eGFR: 83 mL/min/{1.73_m2} (ref >59)

## 2021-02-16 ENCOUNTER — Other Ambulatory Visit: Payer: Self-pay | Admitting: Family Medicine

## 2021-03-10 ENCOUNTER — Ambulatory Visit: Payer: BC Managed Care – PPO | Admitting: Family Medicine

## 2021-04-15 ENCOUNTER — Other Ambulatory Visit: Payer: Self-pay

## 2021-04-15 ENCOUNTER — Encounter: Payer: Self-pay | Admitting: Family Medicine

## 2021-04-15 MED ORDER — ATORVASTATIN CALCIUM 80 MG PO TABS: ORAL_TABLET | ORAL | 1 refills | Status: DC

## 2021-10-15 ENCOUNTER — Other Ambulatory Visit: Payer: Self-pay | Admitting: Family Medicine

## 2021-10-18 NOTE — Telephone Encounter (Signed)
Requested Prescriptions  ?Pending Prescriptions Disp Refills  ?? atorvastatin (LIPITOR) 80 MG tablet [Pharmacy Med Name: ATORVASTATIN '80MG'$  TABLETS] 90 tablet 0  ?  Sig: TAKE 1 TABLET(80 MG) BY MOUTH DAILY  ?  ? Cardiovascular:  Antilipid - Statins Failed - 10/15/2021  3:03 PM  ?  ?  Failed - Lipid Panel in normal range within the last 12 months  ?  Cholesterol, Total  ?Date Value Ref Range Status  ?12/28/2020 89 (L) 100 - 199 mg/dL Final  ? ?LDL Chol Calc (NIH)  ?Date Value Ref Range Status  ?12/28/2020 21 0 - 99 mg/dL Final  ? ?HDL  ?Date Value Ref Range Status  ?12/28/2020 21 (L) >39 mg/dL Final  ? ?Triglycerides  ?Date Value Ref Range Status  ?12/28/2020 329 (H) 0 - 149 mg/dL Final  ? ?  ?  ?  Passed - Patient is not pregnant  ?  ?  Passed - Valid encounter within last 12 months  ?  Recent Outpatient Visits   ?      ? 9 months ago Contusion of fingernail, initial encounter  ? [place], DO  ? 1 year ago Routine general medical examination at a health care facility  ? [place], DO  ? 1 year ago Viral URI  ? [place], DO  ? 1 year ago Essential hypertension  ? [place], DO  ? 2 years ago Routine general medical examination at a health care facility  ? [place], [place], DO  ?  ?  ? ?  ?  ?  ? ?

## 2021-10-29 ENCOUNTER — Other Ambulatory Visit: Payer: Self-pay | Admitting: Family Medicine

## 2021-12-21 ENCOUNTER — Other Ambulatory Visit: Payer: Self-pay | Admitting: Family Medicine

## 2021-12-21 ENCOUNTER — Encounter: Payer: Self-pay | Admitting: Family Medicine

## 2021-12-25 ENCOUNTER — Other Ambulatory Visit: Payer: Self-pay | Admitting: Family Medicine

## 2021-12-27 NOTE — Telephone Encounter (Signed)
Requested Prescriptions  Pending Prescriptions Disp Refills  . atorvastatin (LIPITOR) 80 MG tablet [Pharmacy Med Name: ATORVASTATIN '80MG'$  TABLETS] 90 tablet 0    Sig: TAKE 1 TABLET(80 MG) BY MOUTH DAILY     Cardiovascular:  Antilipid - Statins Failed - 12/25/2021  8:09 AM      Failed - Valid encounter within last 12 months    Recent Outpatient Visits          12 months ago Contusion of fingernail, initial encounter   [place], [place], DO   1 year ago Routine general medical examination at a health care facility   [facility], [place], DO   1 year ago Viral URI   [place], [place], DO   1 year ago Essential hypertension   [place], [place], DO   2 years ago Routine general medical examination at a health care facility   [facility], [place], DO             Failed - Lipid Panel in normal range within the last 12 months    Cholesterol, Total  Date Value Ref Range Status  12/28/2020 89 (L) 100 - 199 mg/dL Final   LDL Chol Calc (NIH)  Date Value Ref Range Status  12/28/2020 21 0 - 99 mg/dL Final   HDL  Date Value Ref Range Status  12/28/2020 21 (L) >39 mg/dL Final   Triglycerides  Date Value Ref Range Status  12/28/2020 329 (H) 0 - 149 mg/dL Final         Passed - Patient is not pregnant

## 2022-02-09 ENCOUNTER — Other Ambulatory Visit: Payer: Self-pay | Admitting: Family Medicine

## 2022-02-10 NOTE — Telephone Encounter (Signed)
30 day courtesy refill. Requested Prescriptions  Pending Prescriptions Disp Refills  . omega-3 acid ethyl esters (LOVAZA) 1 g capsule [Pharmacy Med Name: OMEGA-3-ACID 1GM CAPSULES (RX)] 60 capsule 0    Sig: TAKE 2 CAPSULES BY MOUTH TWICE DAILY     Endocrinology:  Nutritional Agents - omega-3 acid ethyl esters Failed - 02/09/2022 10:04 AM      Failed - Valid encounter within last 12 months    Recent Outpatient Visits          1 year ago Contusion of fingernail, initial encounter   [place], [place], DO   1 year ago Routine general medical examination at a health care facility   [facility], [place], DO   1 year ago Viral URI   [place], [place], DO   1 year ago Essential hypertension   [place], [place], DO   2 years ago Routine general medical examination at a health care facility   [facility], [place], DO             Failed - Lipid Panel in normal range within the last 12 months    Cholesterol, Total  Date Value Ref Range Status  12/28/2020 89 (L) 100 - 199 mg/dL Final   LDL Chol Calc (NIH)  Date Value Ref Range Status  12/28/2020 21 0 - 99 mg/dL Final   HDL  Date Value Ref Range Status  12/28/2020 21 (L) >39 mg/dL Final   Triglycerides  Date Value Ref Range Status  12/28/2020 329 (H) 0 - 149 mg/dL Final

## 2022-02-18 ENCOUNTER — Ambulatory Visit: Payer: BC Managed Care – PPO | Admitting: Family Medicine

## 2022-02-18 ENCOUNTER — Encounter: Payer: Self-pay | Admitting: Family Medicine

## 2022-02-18 VITALS — BP 117/79 | HR 81 | Temp 98.1°F | Wt 204.2 lb

## 2022-02-18 DIAGNOSIS — E782 Mixed hyperlipidemia: Secondary | ICD-10-CM

## 2022-02-18 DIAGNOSIS — I10 Essential (primary) hypertension: Secondary | ICD-10-CM | POA: Diagnosis not present

## 2022-02-18 DIAGNOSIS — Z1211 Encounter for screening for malignant neoplasm of colon: Secondary | ICD-10-CM | POA: Diagnosis not present

## 2022-02-18 MED ORDER — ATORVASTATIN CALCIUM 80 MG PO TABS: 80.0000 mg | ORAL_TABLET | Freq: Every day | ORAL | 1 refills | Status: DC

## 2022-02-18 MED ORDER — OMEGA-3-ACID ETHYL ESTERS 1 G PO CAPS
2.0000 | ORAL_CAPSULE | Freq: Two times a day (BID) | ORAL | 1 refills | Status: DC
Start: 2022-02-18 — End: 2022-08-22

## 2022-02-18 NOTE — Assessment & Plan Note (Signed)
Doing well not on medicine. Will resolved off his problem list.

## 2022-02-18 NOTE — Progress Notes (Signed)
BP 117/79   Pulse 81   Temp 98.1 F (36.7 C)   Wt 204 lb 3.2 oz (92.6 kg)   SpO2 98%   BMI 33.46 kg/m    Subjective:    Patient ID: Angel Montes, male    DOB: 1975-12-10, 46 y.o.   MRN: [id number]  HPI: Angel Montes is a 46 y.o. male  Chief Complaint  Patient presents with   Hyperlipidemia   Hypertension   HYPERTENSION / [place] Satisfied with current treatment? yes Duration of hypertension:  resolved BP monitoring frequency: not checking BP medication side effects: not on anything Duration of hyperlipidemia: chronic Cholesterol medication side effects: no Cholesterol supplements: fish oil Past cholesterol medications: lovaza, atorvastatin Medication compliance: excellent compliance Aspirin: no Recent stressors: no Recurrent headaches: no Visual changes: no Palpitations: no Dyspnea: no Chest pain: no Lower extremity edema: no Dizzy/lightheaded: no   Relevant past medical, surgical, family and social history reviewed and updated as indicated. Interim medical history since our last visit reviewed. Allergies and medications reviewed and updated.  Review of Systems  Constitutional: Negative.   Respiratory: Negative.    Cardiovascular: Negative.   Gastrointestinal: Negative.   Musculoskeletal: Negative.   Skin: Negative.   Psychiatric/Behavioral:  Positive for sleep disturbance. Negative for agitation, behavioral problems, confusion, decreased concentration, dysphoric mood, hallucinations, self-injury and suicidal ideas. The patient is not nervous/anxious and is not hyperactive.     Per HPI unless specifically indicated above     Objective:    BP 117/79   Pulse 81   Temp 98.1 F (36.7 C)   Wt 204 lb 3.2 oz (92.6 kg)   SpO2 98%   BMI 33.46 kg/m   Wt Readings from Last 3 Encounters:  02/18/22 204 lb 3.2 oz (92.6 kg)  12/28/20 190 lb 6.4 oz (86.4 kg)  09/08/20 203 lb (92.1 kg)    Physical Exam Vitals and nursing note reviewed.   Constitutional:      General: He is not in acute distress.    Appearance: Normal appearance. He is normal weight. He is not ill-appearing, toxic-appearing or diaphoretic.  HENT:     Head: Normocephalic and atraumatic.     Right Ear: External ear normal.     Left Ear: External ear normal.     Nose: Nose normal.     Mouth/Throat:     Mouth: Mucous membranes are moist.     Pharynx: Oropharynx is clear.  Eyes:     General: No scleral icterus.       Right eye: No discharge.        Left eye: No discharge.     Extraocular Movements: Extraocular movements intact.     Conjunctiva/sclera: Conjunctivae normal.     Pupils: Pupils are equal, round, and reactive to light.  Cardiovascular:     Rate and Rhythm: Normal rate and regular rhythm.     Pulses: Normal pulses.     Heart sounds: Normal heart sounds. No murmur heard.    No friction rub. No gallop.  Pulmonary:     Effort: Pulmonary effort is normal. No respiratory distress.     Breath sounds: Normal breath sounds. No stridor. No wheezing, rhonchi or rales.  Chest:     Chest wall: No tenderness.  Musculoskeletal:        General: Normal range of motion.     Cervical back: Normal range of motion and neck supple.  Skin:    General: Skin is warm and dry.  Capillary Refill: Capillary refill takes less than 2 seconds.     Coloration: Skin is not jaundiced or pale.     Findings: No bruising, erythema, lesion or rash.  Neurological:     General: No focal deficit present.     Mental Status: He is alert and oriented to person, place, and time. Mental status is at baseline.  Psychiatric:        Mood and Affect: Mood normal.        Behavior: Behavior normal.        Thought Content: Thought content normal.        Judgment: Judgment normal.     Results for orders placed or performed in visit on 12/28/20  Comprehensive metabolic panel  Result Value Ref Range   Glucose 85 65 - 99 mg/dL   BUN 14 6 - 24 mg/dL   Creatinine, Ser 1.11 0.76 -  1.27 mg/dL   eGFR 83 >59 mL/min/1.73   BUN/Creatinine Ratio 13 9 - 20   Sodium 142 134 - 144 mmol/L   Potassium 3.6 3.5 - 5.2 mmol/L   Chloride 106 96 - 106 mmol/L   CO2 22 20 - 29 mmol/L   Calcium 8.9 8.7 - 10.2 mg/dL   Total Protein 6.1 6.0 - 8.5 g/dL   Albumin 4.3 4.0 - 5.0 g/dL   Globulin, Total 1.8 1.5 - 4.5 g/dL   Albumin/Globulin Ratio 2.4 (H) 1.2 - 2.2   Bilirubin Total 0.5 0.0 - 1.2 mg/dL   Alkaline Phosphatase 86 44 - 121 IU/L   AST 22 0 - 40 IU/L   ALT 37 0 - 44 IU/L  Lipid Panel w/o Chol/HDL Ratio  Result Value Ref Range   Cholesterol, Total 89 (L) 100 - 199 mg/dL   Triglycerides 329 (H) 0 - 149 mg/dL   HDL 21 (L) >39 mg/dL   VLDL Cholesterol Cal 47 (H) 5 - 40 mg/dL   LDL Chol Calc (NIH) 21 0 - 99 mg/dL      Assessment & Plan:   Problem List Items Addressed This Visit       Cardiovascular and Mediastinum   RESOLVED: Hypertension - Primary    Doing well not on medicine. Will resolved off his problem list.       Relevant Medications   atorvastatin (LIPITOR) 80 MG tablet   omega-3 acid ethyl esters (LOVAZA) 1 g capsule   Other Relevant Orders   Comprehensive metabolic panel     Other   Hyperlipidemia    Under good control on current regimen. Continue current regimen. Continue to monitor. Call with any concerns. Refills given. Labs drawn today.        Relevant Medications   atorvastatin (LIPITOR) 80 MG tablet   omega-3 acid ethyl esters (LOVAZA) 1 g capsule   Other Relevant Orders   Comprehensive metabolic panel   Lipid Panel w/o Chol/HDL Ratio   Other Visit Diagnoses     Screening for colon cancer       Cologuard ordered today.    Relevant Orders   Cologuard        Follow up plan: Return in about 6 months (around 08/21/2022) for physical.

## 2022-02-18 NOTE — Assessment & Plan Note (Signed)
Under good control on current regimen. Continue current regimen. Continue to monitor. Call with any concerns. Refills given. Labs drawn today.   

## 2022-02-19 LAB — COMPREHENSIVE METABOLIC PANEL
ALT: 44 IU/L (ref 0–44)
AST: 28 IU/L (ref 0–40)
Albumin/Globulin Ratio: 2 (ref 1.2–2.2)
Albumin: 4.6 g/dL (ref 4.1–5.1)
Alkaline Phosphatase: 83 IU/L (ref 44–121)
BUN/Creatinine Ratio: 15 (ref 9–20)
BUN: 13 mg/dL (ref 6–24)
Bilirubin Total: 0.5 mg/dL (ref 0.0–1.2)
CO2: 19 mmol/L — ABNORMAL LOW (ref 20–29)
Calcium: 9.2 mg/dL (ref 8.7–10.2)
Chloride: 104 mmol/L (ref 96–106)
Creatinine, Ser: 0.89 mg/dL (ref 0.76–1.27)
Globulin, Total: 2.3 g/dL (ref 1.5–4.5)
Glucose: 123 mg/dL — ABNORMAL HIGH (ref 70–99)
Potassium: 3.7 mmol/L (ref 3.5–5.2)
Sodium: 140 mmol/L (ref 134–144)
Total Protein: 6.9 g/dL (ref 6.0–8.5)
eGFR: 106 mL/min/{1.73_m2} (ref >59)

## 2022-02-19 LAB — LIPID PANEL W/O CHOL/HDL RATIO
Cholesterol, Total: 128 mg/dL (ref 100–199)
HDL: 19 mg/dL — ABNORMAL LOW (ref >39)
LDL Chol Calc (NIH): 16 mg/dL (ref 0–99)
Triglycerides: 735 mg/dL (ref 0–149)
VLDL Cholesterol Cal: 93 mg/dL — ABNORMAL HIGH (ref 5–40)

## 2022-03-10 LAB — COLOGUARD: COLOGUARD: POSITIVE — AB

## 2022-03-11 ENCOUNTER — Other Ambulatory Visit: Payer: Self-pay | Admitting: Family Medicine

## 2022-03-11 DIAGNOSIS — R195 Other fecal abnormalities: Secondary | ICD-10-CM

## 2022-03-15 ENCOUNTER — Telehealth: Payer: Self-pay

## 2022-03-15 ENCOUNTER — Other Ambulatory Visit: Payer: Self-pay

## 2022-03-15 DIAGNOSIS — R195 Other fecal abnormalities: Secondary | ICD-10-CM

## 2022-03-15 NOTE — Telephone Encounter (Signed)
Gastroenterology Pre-Procedure Review  Request Date: 06/03/22 Requesting Physician: Dr. Marius Ditch  PATIENT REVIEW QUESTIONS: The patient responded to the following health history questions as indicated:    1. Are you having any GI issues?  Positive Colorectal Screening No GI Issues 2. Do you have a personal history of Polyps? no 3. Do you have a family history of Colon Cancer or Polyps? no 4. Diabetes Mellitus? no 5. Joint replacements in the past 12 months?no 6. Major health problems in the past 3 months?no 7. Any artificial heart valves, MVP, or defibrillator?no    MEDICATIONS & ALLERGIES:    Patient reports the following regarding taking any anticoagulation/antiplatelet therapy:   Plavix, Coumadin, Eliquis, Xarelto, Lovenox, Pradaxa, Brilinta, or Effient? no Aspirin? no  Patient confirms/reports the following medications:  Current Outpatient Medications  Medication Sig Dispense Refill   atorvastatin (LIPITOR) 80 MG tablet Take 1 tablet (80 mg total) by mouth daily. 90 tablet 1   omega-3 acid ethyl esters (LOVAZA) 1 g capsule Take 2 capsules (2 g total) by mouth 2 (two) times daily. 180 capsule 1   No current facility-administered medications for this visit.    Patient confirms/reports the following allergies:  No Known Allergies  No orders of the defined types were placed in this encounter.   AUTHORIZATION INFORMATION Primary Insurance: 1D#: Group #:  Secondary Insurance: 1D#: Group #:  SCHEDULE INFORMATION: Date: 06/03/22 Time: Location: [facility]

## 2022-04-27 ENCOUNTER — Ambulatory Visit: Payer: BC Managed Care – PPO | Admitting: Family Medicine

## 2022-04-27 ENCOUNTER — Encounter: Payer: Self-pay | Admitting: Family Medicine

## 2022-04-27 VITALS — BP 126/80 | HR 73 | Temp 98.5°F | Wt 199.9 lb

## 2022-04-27 DIAGNOSIS — M7050 Other bursitis of knee, unspecified knee: Secondary | ICD-10-CM | POA: Diagnosis not present

## 2022-04-27 DIAGNOSIS — Z23 Encounter for immunization: Secondary | ICD-10-CM

## 2022-04-27 NOTE — Progress Notes (Signed)
 BP 126/80   Pulse 73   Temp 98.5 F (36.9 C) (Oral)   Wt 199 lb 14.4 oz (90.7 kg)   SpO2 96%   BMI 32.76 kg/m    Subjective:    Patient ID: Angel Montes, male    DOB: 08/31/1975, 46 y.o.   MRN: [id number]  HPI: Angel Montes is a 46 y.o. male  Chief Complaint  Patient presents with   Knee Pain    Left knee pain started last year    KNEE PAIN Duration: about a year Involved knee: left Mechanism of injury:  running Location: medial Onset: gradual Severity: moderate  Quality:  aching and stiff Frequency: intermittent Radiation: no Aggravating factors: running cross country, first thing in the AM  Alleviating factors: movement  Status: worse Treatments attempted: nothing  Relief with NSAIDs?:  No NSAIDs Taken Weakness with weight bearing or walking: no Sensation of giving way: no Locking: no Popping: no Bruising: no Swelling: no Redness: no Paresthesias/decreased sensation: no Fevers: no  Relevant past medical, surgical, family and social history reviewed and updated as indicated. Interim medical history since our last visit reviewed. Allergies and medications reviewed and updated.  Review of Systems  Constitutional: Negative.   Respiratory: Negative.    Cardiovascular: Negative.   Gastrointestinal: Negative.   Musculoskeletal:  Positive for arthralgias and gait problem. Negative for back pain, joint swelling, myalgias, neck pain and neck stiffness.  Skin: Negative.   Psychiatric/Behavioral: Negative.      Per HPI unless specifically indicated above     Objective:    BP 126/80   Pulse 73   Temp 98.5 F (36.9 C) (Oral)   Wt 199 lb 14.4 oz (90.7 kg)   SpO2 96%   BMI 32.76 kg/m   Wt Readings from Last 3 Encounters:  04/27/22 199 lb 14.4 oz (90.7 kg)  02/18/22 204 lb 3.2 oz (92.6 kg)  12/28/20 190 lb 6.4 oz (86.4 kg)    Physical Exam Vitals and nursing note reviewed.  Constitutional:      General: He is not in acute distress.     Appearance: Normal appearance. He is not ill-appearing, toxic-appearing or diaphoretic.  HENT:     Head: Normocephalic and atraumatic.     Right Ear: External ear normal.     Left Ear: External ear normal.     Nose: Nose normal.     Mouth/Throat:     Mouth: Mucous membranes are moist.     Pharynx: Oropharynx is clear.  Eyes:     General: No scleral icterus.       Right eye: No discharge.        Left eye: No discharge.     Extraocular Movements: Extraocular movements intact.     Conjunctiva/sclera: Conjunctivae normal.     Pupils: Pupils are equal, round, and reactive to light.  Cardiovascular:     Rate and Rhythm: Normal rate and regular rhythm.     Pulses: Normal pulses.     Heart sounds: Normal heart sounds. No murmur heard.    No friction rub. No gallop.  Pulmonary:     Effort: Pulmonary effort is normal. No respiratory distress.     Breath sounds: Normal breath sounds. No stridor. No wheezing, rhonchi or rales.  Chest:     Chest wall: No tenderness.  Musculoskeletal:        General: Tenderness present. No swelling, deformity or signs of injury. Normal range of motion.     Cervical back: Normal range   of motion and neck supple.     Right lower leg: No edema.     Left lower leg: No edema.     Comments: Negative anterior and posterior drawer, negative mcmurrays negative varus valgus, tenderness over pes anserine bursa on the L  Skin:    General: Skin is warm and dry.     Capillary Refill: Capillary refill takes less than 2 seconds.     Coloration: Skin is not jaundiced or pale.     Findings: No bruising, erythema, lesion or rash.  Neurological:     General: No focal deficit present.     Mental Status: He is alert and oriented to person, place, and time. Mental status is at baseline.     Gait: Gait abnormal.  Psychiatric:        Mood and Affect: Mood normal.        Behavior: Behavior normal.        Thought Content: Thought content normal.        Judgment: Judgment normal.      Results for orders placed or performed in visit on 02/18/22  Comprehensive metabolic panel  Result Value Ref Range   Glucose 123 (H) 70 - 99 mg/dL   BUN 13 6 - 24 mg/dL   Creatinine, Ser 0.89 0.76 - 1.27 mg/dL   eGFR 106 >59 mL/min/1.73   BUN/Creatinine Ratio 15 9 - 20   Sodium 140 134 - 144 mmol/L   Potassium 3.7 3.5 - 5.2 mmol/L   Chloride 104 96 - 106 mmol/L   CO2 19 (L) 20 - 29 mmol/L   Calcium 9.2 8.7 - 10.2 mg/dL   Total Protein 6.9 6.0 - 8.5 g/dL   Albumin 4.6 4.1 - 5.1 g/dL   Globulin, Total 2.3 1.5 - 4.5 g/dL   Albumin/Globulin Ratio 2.0 1.2 - 2.2   Bilirubin Total 0.5 0.0 - 1.2 mg/dL   Alkaline Phosphatase 83 44 - 121 IU/L   AST 28 0 - 40 IU/L   ALT 44 0 - 44 IU/L  Lipid Panel w/o Chol/HDL Ratio  Result Value Ref Range   Cholesterol, Total 128 100 - 199 mg/dL   Triglycerides 735 (HH) 0 - 149 mg/dL   HDL 19 (L) >39 mg/dL   VLDL Cholesterol Cal 93 (H) 5 - 40 mg/dL   LDL Chol Calc (NIH) 16 0 - 99 mg/dL  Cologuard  Result Value Ref Range   COLOGUARD Positive (A) Negative      Assessment & Plan:   Problem List Items Addressed This Visit   None Visit Diagnoses     Pes anserine bursitis    -  Primary   Will treat with stretches and voltaren. Discussed brace. Call if not getting better or getting worse.    Needs flu shot       Relevant Orders   Flu Vaccine QUAD 6+ mos PF IM (Fluarix Quad PF)        Follow up plan: No follow-ups on file.      

## 2022-05-29 ENCOUNTER — Encounter: Payer: Self-pay | Admitting: Family Medicine

## 2022-05-31 ENCOUNTER — Telehealth: Payer: Self-pay | Admitting: *Deleted

## 2022-05-31 MED ORDER — NA SULFATE-K SULFATE-MG SULF 17.5-3.13-1.6 GM/177ML PO SOLN: 1.0000 | Freq: Once | ORAL | 0 refills | Status: AC

## 2022-05-31 NOTE — Telephone Encounter (Signed)
Patient called office and stated that he did not received any thing regarding his colonoscopy on Friday 06/03/2022.  I have make sure patient can see the instruction on MyChart. We went over the instructions and sent Rx for the Suprep to [place].  Patient verbalized understanding.

## 2022-06-03 ENCOUNTER — Ambulatory Visit: Payer: BC Managed Care – PPO | Admitting: Anesthesiology

## 2022-06-03 ENCOUNTER — Ambulatory Visit
Admission: RE | Admit: 2022-06-03 | Discharge: 2022-06-03 | Disposition: A | Discharge status: Home / Self Care | Payer: BC Managed Care – PPO | Attending: Gastroenterology | Admitting: Gastroenterology

## 2022-06-03 ENCOUNTER — Encounter
Admission: RE | Disposition: A | Discharge status: Home / Self Care | Payer: Self-pay | Source: Home / Self Care | Attending: Gastroenterology

## 2022-06-03 DIAGNOSIS — Z1211 Encounter for screening for malignant neoplasm of colon: Secondary | ICD-10-CM | POA: Diagnosis present

## 2022-06-03 DIAGNOSIS — R195 Other fecal abnormalities: Secondary | ICD-10-CM | POA: Insufficient documentation

## 2022-06-03 DIAGNOSIS — K5730 Diverticulosis of large intestine without perforation or abscess without bleeding: Secondary | ICD-10-CM | POA: Insufficient documentation

## 2022-06-03 DIAGNOSIS — D122 Benign neoplasm of ascending colon: Secondary | ICD-10-CM | POA: Diagnosis not present

## 2022-06-03 HISTORY — PX: COLONOSCOPY WITH PROPOFOL: SHX5780

## 2022-06-03 SURGERY — COLONOSCOPY WITH PROPOFOL
Anesthesia: General

## 2022-06-03 MED ORDER — PROPOFOL 500 MG/50ML IV EMUL
INTRAVENOUS | Status: DC | PRN
  Administered 2022-06-03: 150 ug/kg/min via INTRAVENOUS

## 2022-06-03 MED ORDER — SODIUM CHLORIDE 0.9 % IV SOLN
INTRAVENOUS | Status: DC
  Administered 2022-06-03: 20 mL/h via INTRAVENOUS

## 2022-06-03 MED ORDER — PROPOFOL 10 MG/ML IV BOLUS
INTRAVENOUS | Status: DC | PRN
  Administered 2022-06-03: 90 mg via INTRAVENOUS

## 2022-06-03 MED ORDER — LIDOCAINE HCL (CARDIAC) PF 100 MG/5ML IV SOSY
PREFILLED_SYRINGE | INTRAVENOUS | Status: DC | PRN
  Administered 2022-06-03: 40 mg via INTRAVENOUS

## 2022-06-03 MED ORDER — PROPOFOL 1000 MG/100ML IV EMUL
INTRAVENOUS | Status: AC
  Filled 2022-06-03: qty 100

## 2022-06-03 NOTE — Anesthesia Postprocedure Evaluation (Signed)
Anesthesia Post Note  Patient: Vahan Wadsworth  Procedure(s) Performed: COLONOSCOPY WITH PROPOFOL  Patient location during evaluation: Endoscopy Anesthesia Type: General Level of consciousness: awake and alert Pain management: pain level controlled Vital Signs Assessment: post-procedure vital signs reviewed and stable Respiratory status: spontaneous breathing, nonlabored ventilation, respiratory function stable and patient connected to nasal cannula oxygen Cardiovascular status: blood pressure returned to baseline and stable Postop Assessment: no apparent nausea or vomiting Anesthetic complications: no   No notable events documented.   Last Vitals:  Vitals:   06/03/22 0905 06/03/22 0915  BP: 109/73 115/81  Pulse: (!) 58 (!) 55  Resp: 18 12  Temp:    SpO2: 100% 100%    Last Pain:  Vitals:   06/03/22 0915  TempSrc:   PainSc: 0-No pain                 Ilene Qua

## 2022-06-03 NOTE — Op Note (Signed)
[facility] Gastroenterology Patient Name: Angel Montes Procedure Date: 06/03/2022 8:22 AM MRN: [id number] Account #: [id number] Date of Birth: April 13, 1976 Admit Type: Outpatient Age: 46 Room: [facility] ENDO ROOM 2 Gender: Male Note Status: Finalized Instrument Name: Colonoscope [id number] Procedure:             Colonoscopy Indications:           This is the patient's first colonoscopy, Positive                         Cologuard test Providers:             Lin Landsman MD, MD Referring MD:          Cato Mulligan (Referring MD) Medicines:             General Anesthesia Complications:         No immediate complications. Estimated blood loss: None. Procedure:             Pre-Anesthesia Assessment:                        - Prior to the procedure, a History and Physical was                         performed, and patient medications and allergies were                         reviewed. The patient is competent. The risks and                         benefits of the procedure and the sedation options and                         risks were discussed with the patient. All questions                         were answered and informed consent was obtained.                         Patient identification and proposed procedure were                         verified by the physician, the nurse, the                         anesthesiologist, the anesthetist and the technician                         in the pre-procedure area in the procedure room in the                         endoscopy suite. Mental Status Examination: alert and                         oriented. Airway Examination: normal oropharyngeal                         airway and neck mobility. Respiratory Examination:  clear to auscultation. CV Examination: normal.                         Prophylactic Antibiotics: The patient does not require                         prophylactic antibiotics. Prior  Anticoagulants: The                         patient has taken no anticoagulant or antiplatelet                         agents. ASA Grade Assessment: II - A patient with mild                         systemic disease. After reviewing the risks and                         benefits, the patient was deemed in satisfactory                         condition to undergo the procedure. The anesthesia                         plan was to use general anesthesia. Immediately prior                         to administration of medications, the patient was                         re-assessed for adequacy to receive sedatives. The                         heart rate, respiratory rate, oxygen saturations,                         blood pressure, adequacy of pulmonary ventilation, and                         response to care were monitored throughout the                         procedure. The physical status of the patient was                         re-assessed after the procedure.                        After obtaining informed consent, the colonoscope was                         passed under direct vision. Throughout the procedure,                         the patient's blood pressure, pulse, and oxygen                         saturations were monitored continuously. The  Colonoscope was introduced through the anus and                         advanced to the the cecum, identified by appendiceal                         orifice and ileocecal valve. The colonoscopy was                         performed without difficulty. The patient tolerated                         the procedure well. The quality of the bowel                         preparation was evaluated using the BBPS [facility] Bowel                         Preparation Scale) with scores of: Right Colon = 3,                         Transverse Colon = 3 and Left Colon = 3 (entire mucosa                         seen well with no residual  staining, small fragments                         of stool or opaque liquid). The total BBPS score                         equals 9. The ileocecal valve, appendiceal orifice,                         and rectum were photographed. Findings:      The perianal and digital rectal examinations were normal. Pertinent       negatives include normal sphincter tone and no palpable rectal lesions.      A 6 mm polyp was found in the ascending colon. The polyp was sessile.       The polyp was removed with a cold snare. Resection and retrieval were       complete. Estimated blood loss: none.      A 12 mm polyp was found in the ascending colon. The polyp was       semi-pedunculated. The polyp was removed with a hot snare. Resection and       retrieval were complete. Estimated blood loss: none.      Multiple diverticula were found in the sigmoid colon.      The retroflexed view of the distal rectum and anal verge was normal and       showed no anal or rectal abnormalities. Impression:            - One 6 mm polyp in the ascending colon, removed with                         a cold snare. Resected and retrieved.                        -  One 12 mm polyp in the ascending colon, removed with                         a hot snare. Resected and retrieved.                        - Diverticulosis in the sigmoid colon.                        - The distal rectum and anal verge are normal on                         retroflexion view. Recommendation:        - Discharge patient to home (with escort).                        - Resume previous diet today.                        - Continue present medications.                        - Await pathology results.                        - Repeat colonoscopy in 3 years for surveillance of                         multiple polyps. Procedure Code(s):     --- Professional ---                        [phone], Colonoscopy, flexible; with removal of                         tumor(s),  polyp(s), or other lesion(s) by snare                         technique Diagnosis Code(s):     --- Professional ---                        D12.2, Benign neoplasm of ascending colon                        R19.5, Other fecal abnormalities                        K57.30, Diverticulosis of large intestine without                         perforation or abscess without bleeding CPT copyright 2022 American [employer] Association. All rights reserved. The codes documented in this report are preliminary and upon coder review may  be revised to meet current compliance requirements. Dr. Ulyess Mort Lin Landsman MD, MD 06/03/2022 8:53:26 AM This report has been signed electronically. Number of Addenda: 0 Note Initiated On: 06/03/2022 8:22 AM Scope Withdrawal Time: 0 hours 12 minutes 34 seconds  Total Procedure Duration: 0 hours 17 minutes 20 seconds  Estimated Blood Loss:  Estimated blood loss: none.      [facility]

## 2022-06-03 NOTE — Anesthesia Preprocedure Evaluation (Signed)
Anesthesia Evaluation  Patient identified by MRN, date of birth, ID band Patient awake    Reviewed: Allergy & Precautions, NPO status , Patient's Chart, lab work & pertinent test results  History of Anesthesia Complications Negative for: history of anesthetic complications  Airway Mallampati: II  TM Distance: >3 FB Neck ROM: full    Dental  (+) Teeth Intact   Pulmonary    Pulmonary exam normal        Cardiovascular negative cardio ROS Normal cardiovascular exam     Neuro/Psych negative neurological ROS  negative psych ROS   GI/Hepatic negative GI ROS, Neg liver ROS,,,  Endo/Other  negative endocrine ROS    Renal/GU negative Renal ROS  negative genitourinary   Musculoskeletal   Abdominal   Peds  Hematology negative hematology ROS (+)   Anesthesia Other Findings Past Medical History: No date: Asthma No date: Hyperlipidemia No date: Hypertension No date: Sciatica  Past Surgical History: No date: CHOLECYSTECTOMY  BMI    Body Mass Index: 31.45 kg/m      Reproductive/Obstetrics negative OB ROS                              Anesthesia Physical Anesthesia Plan  ASA: 2  Anesthesia Plan: General   Post-op Pain Management: Minimal or no pain anticipated   Induction: Intravenous  PONV Risk Score and Plan: Propofol infusion and TIVA  Airway Management Planned: Natural Airway and Nasal Cannula  Additional Equipment:   Intra-op Plan:   Post-operative Plan:   Informed Consent: I have reviewed the patients History and Physical, chart, labs and discussed the procedure including the risks, benefits and alternatives for the proposed anesthesia with the patient or authorized representative who has indicated his/her understanding and acceptance.     Dental Advisory Given  Plan Discussed with: Anesthesiologist, CRNA and Surgeon  Anesthesia Plan Comments: (Patient consented for  risks of anesthesia including but not limited to:  - adverse reactions to medications - risk of airway placement if required - damage to eyes, teeth, lips or other oral mucosa - nerve damage due to positioning  - sore throat or hoarseness - Damage to heart, brain, nerves, lungs, other parts of body or loss of life  Patient voiced understanding.)         Anesthesia Quick Evaluation

## 2022-06-03 NOTE — H&P (Signed)
  Cephas Darby, MD [address]  [place]  [place], [place] [address]  Main: [phone]  Fax: [phone] Pager: [phone]  Primary Care Physician:  Valerie Roys, DO Primary Gastroenterologist:  Dr. Cephas Darby  Pre-Procedure History & Physical: HPI:  Angel Montes is a 46 y.o. male is here for an colonoscopy.   Past Medical History:  Diagnosis Date   Asthma    Hyperlipidemia    Hypertension    Sciatica     Past Surgical History:  Procedure Laterality Date   CHOLECYSTECTOMY      Prior to Admission medications   Medication Sig Start Date End Date Taking? Authorizing Provider  atorvastatin (LIPITOR) 80 MG tablet Take 1 tablet (80 mg total) by mouth daily. 02/18/22   Johnson, Megan P, DO  omega-3 acid ethyl esters (LOVAZA) 1 g capsule Take 2 capsules (2 g total) by mouth 2 (two) times daily. 02/18/22   Valerie Roys, DO    Allergies as of 03/15/2022   (No Known Allergies)    Family History  Problem Relation Age of Onset   Hypertension Mother     Social History   Socioeconomic History   Marital status: Married    Spouse name: Not on file   Number of children: Not on file   Years of education: Not on file   Highest education level: Not on file  Occupational History   Not on file  Tobacco Use   Smoking status: Never   Smokeless tobacco: Never  Vaping Use   Vaping Use: Never used  Substance and Sexual Activity   Alcohol use: Yes    Comment: On occasion/ socially   Drug use: No   Sexual activity: Yes  Other Topics Concern   Not on file  Social History Narrative   Not on file   Social Determinants of Health   Financial Resource Strain: Not on file  Food Insecurity: Not on file  Transportation Needs: Not on file  Physical Activity: Not on file  Stress: Not on file  Social Connections: Not on file  Intimate Partner Violence: Not on file    Review of Systems: See HPI, otherwise negative ROS  Physical Exam: BP 113/86   Pulse  (!) 57   Temp (!) 96.9 F (36.1 C) (Temporal)   Resp 20   Ht '5\' 5"'$  (1.651 m)   Wt 85.7 kg   SpO2 100%   BMI 31.45 kg/m  General:   Alert,  pleasant and cooperative in NAD Head:  Normocephalic and atraumatic. Neck:  Supple; no masses or thyromegaly. Lungs:  Clear throughout to auscultation.    Heart:  Regular rate and rhythm. Abdomen:  Soft, nontender and nondistended. Normal bowel sounds, without guarding, and without rebound.   Neurologic:  Alert and  oriented x4;  grossly normal neurologically.  Impression/Plan: Angel Montes is here for an colonoscopy to be performed for positive cologuard  Risks, benefits, limitations, and alternatives regarding  colonoscopy have been reviewed with the patient.  Questions have been answered.  All parties agreeable.   Sherri Sear, MD  06/03/2022, 8:26 AM

## 2022-06-03 NOTE — Anesthesia Procedure Notes (Signed)
Date/Time: 06/03/2022 8:32 AM  Performed by: Doreen Salvage, CRNAPre-anesthesia Checklist: Patient identified, Emergency Drugs available, Suction available and Patient being monitored Patient Re-evaluated:Patient Re-evaluated prior to induction Oxygen Delivery Method: Nasal cannula Induction Type: IV induction Dental Injury: Teeth and Oropharynx as per pre-operative assessment  Comments: Nasal cannula with etCO2 monitoring

## 2022-06-03 NOTE — Transfer of Care (Signed)
Immediate Anesthesia Transfer of Care Note  Patient: Angel Montes  Procedure(s) Performed: Procedure(s): COLONOSCOPY WITH PROPOFOL (N/A)  Patient Location: PACU and Endoscopy Unit  Anesthesia Type:General  Level of Consciousness: sedated  Airway & Oxygen Therapy: Patient Spontanous Breathing and Patient connected to nasal cannula oxygen  Post-op Assessment: Report given to RN and Post -op Vital signs reviewed and stable  Post vital signs: Reviewed and stable  Last Vitals:  Vitals:   06/03/22 0751 06/03/22 0855  BP: 113/86 107/76  Pulse: (!) 57 60  Resp: 20 18  Temp: (!) 36.1 C 36.4 C  SpO2: 001% 74%    Complications: No apparent anesthesia complications

## 2022-06-06 ENCOUNTER — Encounter: Payer: Self-pay | Admitting: Gastroenterology

## 2022-06-06 LAB — SURGICAL PATHOLOGY

## 2022-06-07 ENCOUNTER — Encounter: Payer: Self-pay | Admitting: Gastroenterology

## 2022-08-22 ENCOUNTER — Encounter: Payer: Self-pay | Admitting: Family Medicine

## 2022-08-22 ENCOUNTER — Ambulatory Visit (INDEPENDENT_AMBULATORY_CARE_PROVIDER_SITE_OTHER): Payer: BC Managed Care – PPO | Admitting: Family Medicine

## 2022-08-22 VITALS — BP 115/80 | HR 71 | Temp 98.7°F | Ht 66.0 in | Wt 198.2 lb

## 2022-08-22 DIAGNOSIS — Z0000 Encounter for general adult medical examination without abnormal findings: Secondary | ICD-10-CM | POA: Diagnosis not present

## 2022-08-22 DIAGNOSIS — H9313 Tinnitus, bilateral: Secondary | ICD-10-CM | POA: Diagnosis not present

## 2022-08-22 DIAGNOSIS — E782 Mixed hyperlipidemia: Secondary | ICD-10-CM | POA: Diagnosis not present

## 2022-08-22 DIAGNOSIS — M25561 Pain in right knee: Secondary | ICD-10-CM | POA: Diagnosis not present

## 2022-08-22 LAB — URINALYSIS, ROUTINE W REFLEX MICROSCOPIC
Bilirubin, UA: NEGATIVE
Glucose, UA: NEGATIVE
Ketones, UA: NEGATIVE
Leukocytes,UA: NEGATIVE
Nitrite, UA: NEGATIVE
Protein,UA: NEGATIVE
RBC, UA: NEGATIVE
Specific Gravity, UA: 1.02 (ref 1.005–1.030)
Urobilinogen, Ur: 0.2 mg/dL (ref 0.2–1.0)
pH, UA: 6 (ref 5.0–7.5)

## 2022-08-22 MED ORDER — ATORVASTATIN CALCIUM 80 MG PO TABS: 80.0000 mg | ORAL_TABLET | Freq: Every day | ORAL | 1 refills | Status: DC

## 2022-08-22 MED ORDER — OMEGA-3-ACID ETHYL ESTERS 1 G PO CAPS: 2.0000 | ORAL_CAPSULE | Freq: Two times a day (BID) | ORAL | 1 refills | Status: DC

## 2022-08-22 NOTE — Assessment & Plan Note (Signed)
Would like to discuss formed ear plugs with audiology. Referral generated today.

## 2022-08-22 NOTE — Progress Notes (Signed)
BP 115/80   Pulse 71   Temp 98.7 F (37.1 C) (Oral)   Ht '5\' 6"'$  (1.676 m)   Wt 198 lb 3.2 oz (89.9 kg)   SpO2 98%   BMI 31.99 kg/m    Subjective:    Patient ID: Angel Montes, male    DOB: [birth year], 47 y.o.   MRN: [id number]  HPI: Angel Montes is a 47 y.o. male presenting on 08/22/2022 for comprehensive medical examination. Current medical complaints include:  KNEE PAIN Duration: 6 weeks Involved knee: right Mechanism of injury:  running Location:anterior Onset: sudden Severity: moderate  Quality:  aching  Frequency: intermittent Radiation: no Aggravating factors: running  Alleviating factors: rest, voltaren  Status: better Treatments attempted:  voltaren and none  Relief with NSAIDs?:  significant Weakness with weight bearing or walking: no Sensation of giving way: no Locking: no Popping: no Bruising: no Swelling: no Redness: no Paresthesias/decreased sensation: no Fevers: no  HYPERLIPIDEMIA Hyperlipidemia status: excellent compliance Satisfied with current treatment?  yes Side effects:  no Medication compliance: excellent compliance Past cholesterol meds: atortvastatin, lovaza Supplements: fish oil Aspirin:  no The ASCVD Risk score (Arnett DK, et al., 2019) failed to calculate for the following reasons:   The valid HDL cholesterol range is 20 to 100 mg/dL   The valid total cholesterol range is 130 to 320 mg/dL Chest pain:  no Coronary artery disease:  no  Interim Problems from his last visit: no  Depression Screen done today and results listed below:     08/22/2022    9:22 AM 08/22/2022    9:14 AM 04/27/2022   11:04 AM 09/08/2020    8:07 AM 08/13/2019    8:09 AM  Depression screen PHQ 2/9  Decreased Interest 0 0 0 0 0  Down, Depressed, Hopeless 0 0 0 0 0  PHQ - 2 Score 0 0 0 0 0  Altered sleeping 0 1 0  0  Tired, decreased energy 0 0 0  0  Change in appetite 0 0 0  0  Feeling bad or failure about yourself  0 0 0  0  Trouble concentrating 0 0  0  0  Moving slowly or fidgety/restless 0 0 0  0  Suicidal thoughts 0 0 0  0  PHQ-9 Score 0 1 0  0  Difficult doing work/chores Not difficult at all Not difficult at all Not difficult at all  Not difficult at all    Past Medical History:  Past Medical History:  Diagnosis Date   Asthma    Hyperlipidemia    Hypertension    Sciatica     Surgical History:  Past Surgical History:  Procedure Laterality Date   CHOLECYSTECTOMY     COLONOSCOPY WITH PROPOFOL N/A 06/03/2022   Procedure: COLONOSCOPY WITH PROPOFOL;  Surgeon: Lin Landsman, MD;  Location: [facility] ENDOSCOPY;  Service: Gastroenterology;  Laterality: N/A;    Medications:  No current outpatient medications on file prior to visit.   No current facility-administered medications on file prior to visit.    Allergies:  No Known Allergies  Social History:  Social History   Socioeconomic History   Marital status: Married    Spouse name: Not on file   Number of children: Not on file   Years of education: Not on file   Highest education level: Not on file  Occupational History   Not on file  Tobacco Use   Smoking status: Never   Smokeless tobacco: Never  Vaping Use  Vaping Use: Never used  Substance and Sexual Activity   Alcohol use: Yes    Comment: On occasion/ socially   Drug use: No   Sexual activity: Yes  Other Topics Concern   Not on file  Social History Narrative   Not on file   Social Determinants of Health   Financial Resource Strain: Not on file  Food Insecurity: Not on file  Transportation Needs: Not on file  Physical Activity: Not on file  Stress: Not on file  Social Connections: Not on file  Intimate Partner Violence: Not on file   Social History   Tobacco Use  Smoking Status Never  Smokeless Tobacco Never   Social History   Substance and Sexual Activity  Alcohol Use Yes   Comment: On occasion/ socially    Family History:  Family History  Problem Relation Age of Onset    Hypertension Mother     Past medical history, surgical history, medications, allergies, family history and social history reviewed with patient today and changes made to appropriate areas of the chart.   Review of Systems  Constitutional: Negative.   HENT:  Positive for tinnitus. Negative for congestion, ear discharge, ear pain, hearing loss, nosebleeds, sinus pain and sore throat.   Eyes: Negative.   Respiratory: Negative.  Negative for stridor.   Cardiovascular: Negative.   Gastrointestinal: Negative.   Genitourinary: Negative.   Musculoskeletal:  Positive for joint pain. Negative for back pain, falls, myalgias and neck pain.  Skin: Negative.   Neurological: Negative.   Endo/Heme/Allergies: Negative.   Psychiatric/Behavioral: Negative.     All other ROS negative except what is listed above and in the HPI.      Objective:    BP 115/80   Pulse 71   Temp 98.7 F (37.1 C) (Oral)   Ht '5\' 6"'$  (1.676 m)   Wt 198 lb 3.2 oz (89.9 kg)   SpO2 98%   BMI 31.99 kg/m   Wt Readings from Last 3 Encounters:  08/22/22 198 lb 3.2 oz (89.9 kg)  06/03/22 189 lb (85.7 kg)  04/27/22 199 lb 14.4 oz (90.7 kg)    Physical Exam Vitals and nursing note reviewed.  Constitutional:      General: He is not in acute distress.    Appearance: Normal appearance. He is obese. He is not ill-appearing, toxic-appearing or diaphoretic.  HENT:     Head: Normocephalic and atraumatic.     Right Ear: Tympanic membrane, ear canal and external ear normal. There is no impacted cerumen.     Left Ear: Tympanic membrane, ear canal and external ear normal. There is no impacted cerumen.     Nose: Nose normal. No congestion or rhinorrhea.     Mouth/Throat:     Mouth: Mucous membranes are moist.     Pharynx: Oropharynx is clear. No oropharyngeal exudate or posterior oropharyngeal erythema.  Eyes:     General: No scleral icterus.       Right eye: No discharge.        Left eye: No discharge.     Extraocular Movements:  Extraocular movements intact.     Conjunctiva/sclera: Conjunctivae normal.     Pupils: Pupils are equal, round, and reactive to light.  Neck:     Vascular: No carotid bruit.  Cardiovascular:     Rate and Rhythm: Normal rate and regular rhythm.     Pulses: Normal pulses.     Heart sounds: No murmur heard.    No friction rub. No  gallop.  Pulmonary:     Effort: Pulmonary effort is normal. No respiratory distress.     Breath sounds: Normal breath sounds. No stridor. No wheezing, rhonchi or rales.  Chest:     Chest wall: No tenderness.  Abdominal:     General: Abdomen is flat. Bowel sounds are normal. There is no distension.     Palpations: Abdomen is soft. There is no mass.     Tenderness: There is no abdominal tenderness. There is no right CVA tenderness, left CVA tenderness, guarding or rebound.     Hernia: No hernia is present.  Genitourinary:    Comments: Genital exam deferred with shared decision making Musculoskeletal:        General: No swelling, tenderness, deformity or signs of injury.     Cervical back: Normal range of motion and neck supple. No rigidity. No muscular tenderness.     Right lower leg: No edema.     Left lower leg: No edema.  Lymphadenopathy:     Cervical: No cervical adenopathy.  Skin:    General: Skin is warm and dry.     Capillary Refill: Capillary refill takes less than 2 seconds.     Coloration: Skin is not jaundiced or pale.     Findings: No bruising, erythema, lesion or rash.  Neurological:     General: No focal deficit present.     Mental Status: He is alert and oriented to person, place, and time.     Cranial Nerves: No cranial nerve deficit.     Sensory: No sensory deficit.     Motor: No weakness.     Coordination: Coordination normal.     Gait: Gait normal.     Deep Tendon Reflexes: Reflexes normal.  Psychiatric:        Mood and Affect: Mood normal.        Behavior: Behavior normal.        Thought Content: Thought content normal.         Judgment: Judgment normal.     Results for orders placed or performed during the hospital encounter of 06/03/22  Surgical pathology  Result Value Ref Range   SURGICAL PATHOLOGY      SURGICAL PATHOLOGY CASE: [id number] PATIENT: Etheleen Nicks Surgical Pathology Report     Specimen Submitted: A. Colon polyps x2, asc, hot snare(1); cold snare(1)  Clinical History: Positive colorectal using Cologuard. Polyps and diverticulosis.    DIAGNOSIS: A. COLON POLYPS X 2, ASCENDING; HOT AND COLD SNARES: - SINGLE FRAGMENT OF TUBULOVILLOUS ADENOMA. - FRAGMENTS OF SESSILE SERRATED POLYP. - LOW-GRADE DYSPLASIA PRESENT AT CAUTERY IN THE LARGEST POLYP FRAGMENT. - NEGATIVE FOR HIGH-GRADE DYSPLASIA AND MALIGNANCY.  GROSS DESCRIPTION: A. Labeled: Hot x 1/cold snare x 1 ascending colon polyps Received: Formalin Collection time: 8:39 AM on 06/03/2022 Placed into formalin time: 8:39 AM on 06/03/2022 Tissue fragment(s): Multiple Size: Aggregate, 1.2 x 0.8 x 0.6 cm Description: Received are at least 2 fragments of tan soft tissue admixed with intestinal debris.  The ratio of soft tissue to intestinal debris is 90: 10.  The larger soft tissue fragment has a  resection margin which is inked blue.  This fragment is trisected. Entirely submitted in cassettes 1-2 with the serially sectioned fragment in cassette 1 and the remaining fragments in cassette 2.  RB 06/03/2022  Final Diagnosis performed by Allena Napoleon, MD.   Electronically signed 06/06/2022 8:51:01AM The electronic signature indicates that the named Attending Pathologist has evaluated the specimen Technical component performed at  [facility] [address] [place], [place] [address] Lab: [phone] Dir: Rush Farmer, MD, MMM  Professional component performed at [facility], [facility], [place], [place], [place] [address] Lab: [phone] Dir: Kathi Simpers, MD       Assessment & Plan:   Problem List  Items Addressed This Visit       Other   Hyperlipidemia    Under good control on current regimen. Continue current regimen. Continue to monitor. Call with any concerns. Refills given. Labs drawn today.        Relevant Medications   atorvastatin (LIPITOR) 80 MG tablet   omega-3 acid ethyl esters (LOVAZA) 1 g capsule   Tinnitus of both ears    Would like to discuss formed ear plugs with audiology. Referral generated today.      Relevant Orders   Ambulatory referral to Audiology   Other Visit Diagnoses     Routine general medical examination at a health care facility    -  Primary   Vaccines up to date. Screening labs checked today. Colonoscopy up to date. Continue diet and exercise. Call with any concerns.   Relevant Orders   Comprehensive metabolic panel   CBC with Differential/Platelet   Lipid Panel w/o Chol/HDL Ratio   PSA   TSH   Urinalysis, Routine w reflex microscopic   Acute pain of right knee       Improving. Will continue voltaren PRN and start stretches. If gettng worse with restarting running will get x-ray. Order in. Call with any concerns.   Relevant Orders   DG Knee Complete 4 Views Right        Discussed aspirin prophylaxis for myocardial infarction prevention and decision was it was not indicated  LABORATORY TESTING:  Health maintenance labs ordered today as discussed above.   The natural history of prostate cancer and ongoing controversy regarding screening and potential treatment outcomes of prostate cancer has been discussed with the patient. The meaning of a false positive PSA and a false negative PSA has been discussed. He indicates understanding of the limitations of this screening test and wishes to proceed with screening PSA testing.   IMMUNIZATIONS:   - Tdap: Tetanus vaccination status reviewed: last tetanus booster within 10 years. - Influenza: Up to date - Pneumovax: Not applicable - Prevnar: Not applicable - COVID: Up to date - HPV: Not  applicable - Shingrix vaccine: Not applicable  SCREENING: - Colonoscopy: Up to date  Discussed with patient purpose of the colonoscopy is to detect colon cancer at curable precancerous or early stages   PATIENT COUNSELING:    Sexuality: Discussed sexually transmitted diseases, partner selection, use of condoms, avoidance of unintended pregnancy  and contraceptive alternatives.   Advised to avoid cigarette smoking.  I discussed with the patient that most people either abstain from alcohol or drink within safe limits (<=14/week and <=4 drinks/occasion for males, <=7/weeks and <= 3 drinks/occasion for females) and that the risk for alcohol disorders and other health effects rises proportionally with the number of drinks per week and how often a drinker exceeds daily limits.  Discussed cessation/primary prevention of drug use and availability of treatment for abuse.   Diet: Encouraged to adjust caloric intake to maintain  or achieve ideal body weight, to reduce intake of dietary saturated fat and total fat, to limit sodium intake by avoiding high sodium foods and not adding table salt, and to maintain adequate dietary potassium and calcium preferably from fresh fruits, vegetables, and low-fat  dairy products.    stressed the importance of regular exercise  Injury prevention: Discussed safety belts, safety helmets, smoke detector, smoking near bedding or upholstery.   Dental health: Discussed importance of regular tooth brushing, flossing, and dental visits.   Follow up plan: NEXT PREVENTATIVE PHYSICAL DUE IN 1 YEAR. Return in about 6 months (around 02/20/2023).

## 2022-08-22 NOTE — Assessment & Plan Note (Signed)
Under good control on current regimen. Continue current regimen. Continue to monitor. Call with any concerns. Refills given. Labs drawn today.   

## 2022-08-23 LAB — CBC WITH DIFFERENTIAL/PLATELET
Basophils Absolute: 0 10*3/uL (ref 0.0–0.2)
Basos: 1 %
EOS (ABSOLUTE): 0.1 10*3/uL (ref 0.0–0.4)
Eos: 1 %
Hematocrit: 46.2 % (ref 37.5–51.0)
Hemoglobin: 15.7 g/dL (ref 13.0–17.7)
Immature Grans (Abs): 0 10*3/uL (ref 0.0–0.1)
Immature Granulocytes: 0 %
Lymphocytes Absolute: 1.9 10*3/uL (ref 0.7–3.1)
Lymphs: 33 %
MCH: 30.6 pg (ref 26.6–33.0)
MCHC: 34 g/dL (ref 31.5–35.7)
MCV: 90 fL (ref 79–97)
Monocytes Absolute: 0.4 10*3/uL (ref 0.1–0.9)
Monocytes: 7 %
Neutrophils Absolute: 3.5 10*3/uL (ref 1.4–7.0)
Neutrophils: 58 %
Platelets: 150 10*3/uL (ref 150–450)
RBC: 5.13 x10E6/uL (ref 4.14–5.80)
RDW: 13.6 % (ref 11.6–15.4)
WBC: 6 10*3/uL (ref 3.4–10.8)

## 2022-08-23 LAB — COMPREHENSIVE METABOLIC PANEL
ALT: 31 IU/L (ref 0–44)
AST: 20 IU/L (ref 0–40)
Albumin/Globulin Ratio: 1.8 (ref 1.2–2.2)
Albumin: 4.4 g/dL (ref 4.1–5.1)
Alkaline Phosphatase: 82 IU/L (ref 44–121)
BUN/Creatinine Ratio: 15 (ref 9–20)
BUN: 12 mg/dL (ref 6–24)
Bilirubin Total: 0.6 mg/dL (ref 0.0–1.2)
CO2: 20 mmol/L (ref 20–29)
Calcium: 9.3 mg/dL (ref 8.7–10.2)
Chloride: 102 mmol/L (ref 96–106)
Creatinine, Ser: 0.81 mg/dL (ref 0.76–1.27)
Globulin, Total: 2.5 g/dL (ref 1.5–4.5)
Glucose: 111 mg/dL — ABNORMAL HIGH (ref 70–99)
Potassium: 4 mmol/L (ref 3.5–5.2)
Sodium: 139 mmol/L (ref 134–144)
Total Protein: 6.9 g/dL (ref 6.0–8.5)
eGFR: 109 mL/min/{1.73_m2} (ref >59)

## 2022-08-23 LAB — PSA: Prostate Specific Ag, Serum: 1 ng/mL (ref 0.0–4.0)

## 2022-08-23 LAB — LIPID PANEL W/O CHOL/HDL RATIO
Cholesterol, Total: 150 mg/dL (ref 100–199)
HDL: 20 mg/dL — ABNORMAL LOW (ref >39)
LDL Chol Calc (NIH): 27 mg/dL (ref 0–99)
Triglycerides: 785 mg/dL (ref 0–149)
VLDL Cholesterol Cal: 103 mg/dL — ABNORMAL HIGH (ref 5–40)

## 2022-08-23 LAB — TSH: TSH: 2 u[IU]/mL (ref 0.450–4.500)

## 2023-02-10 ENCOUNTER — Other Ambulatory Visit: Payer: Self-pay | Admitting: Family Medicine

## 2023-02-10 NOTE — Telephone Encounter (Signed)
Requested Prescriptions  Pending Prescriptions Disp Refills   atorvastatin (LIPITOR) 80 MG tablet [Pharmacy Med Name: ATORVASTATIN 80MG  TABLETS] 90 tablet 1    Sig: TAKE 1 TABLET(80 MG) BY MOUTH DAILY     Cardiovascular:  Antilipid - Statins Failed - 02/10/2023 12:12 PM      Failed - Lipid Panel in normal range within the last 12 months    Cholesterol, Total  Date Value Ref Range Status  08/22/2022 150 100 - 199 mg/dL Final   LDL Chol Calc (NIH)  Date Value Ref Range Status  08/22/2022 27 0 - 99 mg/dL Final   HDL  Date Value Ref Range Status  08/22/2022 20 (L) >39 mg/dL Final   Triglycerides  Date Value Ref Range Status  08/22/2022 785 (HH) 0 - 149 mg/dL Final         Passed - Patient is not pregnant      Passed - Valid encounter within last 12 months    Recent Outpatient Visits           5 months ago Routine general medical examination at a health care facility   [facility], Megan P, DO   9 months ago Pes anserine bursitis   [facility], [place], DO   11 months ago Primary hypertension   [facility] [place], [place], DO   2 years ago Contusion of fingernail, initial encounter   [facility] [place], Megan P, DO   2 years ago Routine general medical examination at a health care facility   [facility] Dorcas Carrow, DO       Future Appointments             In 1 week Dorcas Carrow, DO  [facility]

## 2023-02-20 ENCOUNTER — Ambulatory Visit
Admission: RE | Admit: 2023-02-20 | Discharge: 2023-02-20 | Disposition: A | Discharge status: Home / Self Care | Payer: BC Managed Care – PPO | Source: Ambulatory Visit | Attending: Family Medicine | Admitting: Family Medicine

## 2023-02-20 ENCOUNTER — Encounter: Payer: Self-pay | Admitting: Family Medicine

## 2023-02-20 ENCOUNTER — Ambulatory Visit
Admission: RE | Admit: 2023-02-20 | Discharge: 2023-02-20 | Disposition: A | Discharge status: Home / Self Care | Payer: BC Managed Care – PPO | Attending: Family Medicine | Admitting: Family Medicine

## 2023-02-20 ENCOUNTER — Ambulatory Visit: Payer: BC Managed Care – PPO | Admitting: Family Medicine

## 2023-02-20 VITALS — BP 135/89 | HR 62 | Temp 97.7°F | Wt 200.6 lb

## 2023-02-20 DIAGNOSIS — M25562 Pain in left knee: Secondary | ICD-10-CM

## 2023-02-20 DIAGNOSIS — E782 Mixed hyperlipidemia: Secondary | ICD-10-CM | POA: Diagnosis not present

## 2023-02-20 DIAGNOSIS — M25561 Pain in right knee: Secondary | ICD-10-CM | POA: Diagnosis present

## 2023-02-20 MED ORDER — OMEGA-3-ACID ETHYL ESTERS 1 G PO CAPS: 2.0000 | ORAL_CAPSULE | Freq: Two times a day (BID) | ORAL | 1 refills | Status: DC

## 2023-02-20 MED ORDER — MELOXICAM 15 MG PO TABS: 15.0000 mg | ORAL_TABLET | Freq: Every day | ORAL | 1 refills | Status: DC

## 2023-02-20 MED ORDER — ATORVASTATIN CALCIUM 80 MG PO TABS: 80.0000 mg | ORAL_TABLET | Freq: Every day | ORAL | 1 refills | Status: DC

## 2023-02-20 NOTE — Assessment & Plan Note (Signed)
Under good control on current regimen. Continue current regimen. Continue to monitor. Call with any concerns. Refills given. Labs drawn today.   

## 2023-02-20 NOTE — Progress Notes (Signed)
BP 135/89   Pulse 62   Temp 97.7 F (36.5 C) (Oral)   Wt 200 lb 9.6 oz (91 kg)   SpO2 96%   BMI 32.38 kg/m    Subjective:    Patient ID: Angel Montes, male    DOB: 1976-06-24, 47 y.o.   MRN: [id number]  HPI: Angel Montes is a 47 y.o. male  Chief Complaint  Patient presents with   Hyperlipidemia   HYPERLIPIDEMIA Hyperlipidemia status: excellent compliance Satisfied with current treatment?  yes Side effects:  no Medication compliance: excellent compliance Past cholesterol meds: atorvastatin, lovaza Supplements: fish oil Aspirin:  no The 10-year ASCVD risk score (Arnett DK, et al., 2019) is: 6.1%   Values used to calculate the score:     Age: 66 years     Sex: Male     Is Non-Hispanic African American: No     Diabetic: No     Tobacco smoker: No     Systolic Blood Pressure: 135 mmHg     Is BP treated: No     HDL Cholesterol: 20 mg/dL     Total Cholesterol: 150 mg/dL Chest pain:  no Coronary artery disease:  no  KNEE PAIN Duration: weeks Involved knee: bilateral Mechanism of injury: unknown Location:diffuse Onset: gradual Severity: moderate  Quality:  aching and sore and tight Frequency: with sitting long periods and walking a lot Radiation: no Aggravating factors: sitting long periods and walking a lot  Alleviating factors: rest  Status: worse Treatments attempted: rest, ice, heat, APAP, and ibuprofen  Weakness with weight bearing or walking: no Sensation of giving way: no Locking: no Popping: no Bruising: no Swelling: no Redness: no Paresthesias/decreased sensation: no Fevers: no   Relevant past medical, surgical, family and social history reviewed and updated as indicated. Interim medical history since our last visit reviewed. Allergies and medications reviewed and updated.  Review of Systems  Constitutional: Negative.   Respiratory: Negative.    Cardiovascular: Negative.   Gastrointestinal: Negative.   Musculoskeletal:  Positive for  arthralgias. Negative for back pain, gait problem, joint swelling, myalgias, neck pain and neck stiffness.  Skin: Negative.   Neurological: Negative.   Psychiatric/Behavioral: Negative.      Per HPI unless specifically indicated above     Objective:    BP 135/89   Pulse 62   Temp 97.7 F (36.5 C) (Oral)   Wt 200 lb 9.6 oz (91 kg)   SpO2 96%   BMI 32.38 kg/m   Wt Readings from Last 3 Encounters:  02/20/23 200 lb 9.6 oz (91 kg)  08/22/22 198 lb 3.2 oz (89.9 kg)  06/03/22 189 lb (85.7 kg)    Physical Exam Vitals and nursing note reviewed.  Constitutional:      General: He is not in acute distress.    Appearance: Normal appearance. He is not ill-appearing, toxic-appearing or diaphoretic.  HENT:     Head: Normocephalic and atraumatic.     Right Ear: External ear normal.     Left Ear: External ear normal.     Nose: Nose normal.     Mouth/Throat:     Mouth: Mucous membranes are moist.     Pharynx: Oropharynx is clear.  Eyes:     General: No scleral icterus.       Right eye: No discharge.        Left eye: No discharge.     Extraocular Movements: Extraocular movements intact.     Conjunctiva/sclera: Conjunctivae normal.  Pupils: Pupils are equal, round, and reactive to light.  Cardiovascular:     Rate and Rhythm: Normal rate and regular rhythm.     Pulses: Normal pulses.     Heart sounds: Normal heart sounds. No murmur heard.    No friction rub. No gallop.  Pulmonary:     Effort: Pulmonary effort is normal. No respiratory distress.     Breath sounds: Normal breath sounds. No stridor. No wheezing, rhonchi or rales.  Chest:     Chest wall: No tenderness.  Musculoskeletal:        General: Normal range of motion.     Cervical back: Normal range of motion and neck supple.  Skin:    General: Skin is warm and dry.     Capillary Refill: Capillary refill takes less than 2 seconds.     Coloration: Skin is not jaundiced or pale.     Findings: No bruising, erythema, lesion  or rash.  Neurological:     General: No focal deficit present.     Mental Status: He is alert and oriented to person, place, and time. Mental status is at baseline.  Psychiatric:        Mood and Affect: Mood normal.        Behavior: Behavior normal.        Thought Content: Thought content normal.        Judgment: Judgment normal.     Results for orders placed or performed in visit on 08/22/22  Comprehensive metabolic panel  Result Value Ref Range   Glucose 111 (H) 70 - 99 mg/dL   BUN 12 6 - 24 mg/dL   Creatinine, Ser 6.57 0.76 - 1.27 mg/dL   eGFR 846 >96 EX/BMW/4.13   BUN/Creatinine Ratio 15 9 - 20   Sodium 139 134 - 144 mmol/L   Potassium 4.0 3.5 - 5.2 mmol/L   Chloride 102 96 - 106 mmol/L   CO2 20 20 - 29 mmol/L   Calcium 9.3 8.7 - 10.2 mg/dL   Total Protein 6.9 6.0 - 8.5 g/dL   Albumin 4.4 4.1 - 5.1 g/dL   Globulin, Total 2.5 1.5 - 4.5 g/dL   Albumin/Globulin Ratio 1.8 1.2 - 2.2   Bilirubin Total 0.6 0.0 - 1.2 mg/dL   Alkaline Phosphatase 82 44 - 121 IU/L   AST 20 0 - 40 IU/L   ALT 31 0 - 44 IU/L  CBC with Differential/Platelet  Result Value Ref Range   WBC 6.0 3.4 - 10.8 x10E3/uL   RBC 5.13 4.14 - 5.80 x10E6/uL   Hemoglobin 15.7 13.0 - 17.7 g/dL   Hematocrit 24.4 01.0 - 51.0 %   MCV 90 79 - 97 fL   MCH 30.6 26.6 - 33.0 pg   MCHC 34.0 31.5 - 35.7 g/dL   RDW 27.2 53.6 - 64.4 %   Platelets 150 150 - 450 x10E3/uL   Neutrophils 58 Not Estab. %   Lymphs 33 Not Estab. %   Monocytes 7 Not Estab. %   Eos 1 Not Estab. %   Basos 1 Not Estab. %   Neutrophils Absolute 3.5 1.4 - 7.0 x10E3/uL   Lymphocytes Absolute 1.9 0.7 - 3.1 x10E3/uL   Monocytes Absolute 0.4 0.1 - 0.9 x10E3/uL   EOS (ABSOLUTE) 0.1 0.0 - 0.4 x10E3/uL   Basophils Absolute 0.0 0.0 - 0.2 x10E3/uL   Immature Granulocytes 0 Not Estab. %   Immature Grans (Abs) 0.0 0.0 - 0.1 x10E3/uL  Lipid Panel w/o Chol/HDL Ratio  Result  Value Ref Range   Cholesterol, Total 150 100 - 199 mg/dL   Triglycerides 657 (HH) 0  - 149 mg/dL   HDL 20 (L) >84 mg/dL   VLDL Cholesterol Cal 103 (H) 5 - 40 mg/dL   LDL Chol Calc (NIH) 27 0 - 99 mg/dL  PSA  Result Value Ref Range   Prostate Specific Ag, Serum 1.0 0.0 - 4.0 ng/mL  TSH  Result Value Ref Range   TSH 2.000 0.450 - 4.500 uIU/mL  Urinalysis, Routine w reflex microscopic  Result Value Ref Range   Specific Gravity, UA 1.020 1.005 - 1.030   pH, UA 6.0 5.0 - 7.5   Color, UA Yellow Yellow   Appearance Ur Clear Clear   Leukocytes,UA Negative Negative   Protein,UA Negative Negative/Trace   Glucose, UA Negative Negative   Ketones, UA Negative Negative   RBC, UA Negative Negative   Bilirubin, UA Negative Negative   Urobilinogen, Ur 0.2 0.2 - 1.0 mg/dL   Nitrite, UA Negative Negative   Microscopic Examination Comment       Assessment & Plan:   Problem List Items Addressed This Visit       Other   Hyperlipidemia - Primary    Under good control on current regimen. Continue current regimen. Continue to monitor. Call with any concerns. Refills given. Labs drawn today.       Relevant Medications   atorvastatin (LIPITOR) 80 MG tablet   omega-3 acid ethyl esters (LOVAZA) 1 g capsule   Other Relevant Orders   Lipid Panel w/o Chol/HDL Ratio   Comprehensive metabolic panel   Other Visit Diagnoses     Acute pain of both knees       Will treat with meloxicam and obtain x-rays. Await results. Treat as needed.   Relevant Orders   DG Knee Complete 4 Views Left        Follow up plan: Return in about 6 months (around 08/23/2023) for physical.

## 2023-08-24 ENCOUNTER — Encounter: Payer: BC Managed Care – PPO | Admitting: Family Medicine

## 2023-09-05 ENCOUNTER — Ambulatory Visit (INDEPENDENT_AMBULATORY_CARE_PROVIDER_SITE_OTHER): Payer: 59 | Admitting: Family Medicine

## 2023-09-05 VITALS — BP 120/80 | HR 82 | Temp 98.6°F | Resp 14 | Ht 62.52 in | Wt 206.2 lb

## 2023-09-05 DIAGNOSIS — J069 Acute upper respiratory infection, unspecified: Secondary | ICD-10-CM | POA: Diagnosis not present

## 2023-09-05 DIAGNOSIS — E782 Mixed hyperlipidemia: Secondary | ICD-10-CM

## 2023-09-05 DIAGNOSIS — Z0000 Encounter for general adult medical examination without abnormal findings: Secondary | ICD-10-CM | POA: Diagnosis not present

## 2023-09-05 DIAGNOSIS — R42 Dizziness and giddiness: Secondary | ICD-10-CM

## 2023-09-05 LAB — BAYER DCA HB A1C WAIVED: HB A1C (BAYER DCA - WAIVED): 6.1 % — ABNORMAL HIGH (ref 4.8–5.6)

## 2023-09-05 MED ORDER — OMEGA-3-ACID ETHYL ESTERS 1 G PO CAPS: 2.0000 | ORAL_CAPSULE | Freq: Two times a day (BID) | ORAL | 1 refills | Status: DC

## 2023-09-05 MED ORDER — PREDNISONE 50 MG PO TABS: 50.0000 mg | ORAL_TABLET | Freq: Every day | ORAL | 0 refills | Status: DC

## 2023-09-05 MED ORDER — MELOXICAM 15 MG PO TABS: 15.0000 mg | ORAL_TABLET | Freq: Every day | ORAL | 1 refills | Status: DC

## 2023-09-05 MED ORDER — ATORVASTATIN CALCIUM 80 MG PO TABS: 80.0000 mg | ORAL_TABLET | Freq: Every day | ORAL | 1 refills | Status: DC

## 2023-09-05 NOTE — Progress Notes (Signed)
 BP 120/80 (BP Location: Left Arm, Patient Position: Sitting, Cuff Size: Large)   Pulse 82   Temp 98.6 F (37 C) (Oral)   Resp 14   Ht 5' 2.52" (1.588 m)   Wt 206 lb 3.2 oz (93.5 kg)   SpO2 98%   BMI 37.09 kg/m    Subjective:    Patient ID: Angel Montes, male    DOB: Dec 14, 1975, 48 y.o.   MRN: [id number]  HPI: Angel Montes is a 48 y.o. male presenting on 09/05/2023 for comprehensive medical examination. Current medical complaints include:  Has had some mild episodes of dizziness 2-3x in the past 6 months. Not room spinning- more of an off balance feeling. He hasn't quite had tunnel vision, but feels separtated from what he's seeing. He hasn't noticed a pattern. Totally at random. Hadn't felt sick at all before that. It generally lasts a a couple to at most 10 minutes.   UPPER RESPIRATORY TRACT INFECTION Duration: couple of days Worst symptom: sore throat and cough Fever: no Cough: yes Shortness of breath: no Wheezing: no Chest pain: no Chest tightness: no Chest congestion: no Nasal congestion: no Runny nose: yes Post nasal drip: no Sneezing: no Sore throat: yes Swollen glands: no Sinus pressure: no Headache: no Face pain: no Toothache: no Ear pain: no  Ear pressure: no  Eyes red/itching:no Eye drainage/crusting: no  Vomiting: no Rash: no Fatigue: yes Sick contacts: yes Strep contacts: no  Context: worse Recurrent sinusitis: no Relief with OTC cold/cough medications: no  Treatments attempted: none   HYPERLIPIDEMIA Hyperlipidemia status: excellent compliance Satisfied with current treatment?  yes Side effects:  no Medication compliance: excellent compliance Past cholesterol meds: atorvastatin, lovaza Supplements: fish oil Aspirin:  no The ASCVD Risk score (Arnett DK, et al., 2019) failed to calculate for the following reasons:   The valid HDL cholesterol range is 20 to 100 mg/dL   The valid total cholesterol range is 130 to 320 mg/dL Chest pain:   no Coronary artery disease:  no  He currently lives with: wife and kids Interim Problems from his last visit: no  Depression Screen done today and results listed below:     09/05/2023    3:00 PM 09/05/2023    2:59 PM 02/20/2023    9:01 AM 08/22/2022    9:22 AM 08/22/2022    9:14 AM  Depression screen PHQ 2/9  Decreased Interest 0 0 0 0 0  Down, Depressed, Hopeless 0 0 0 0 0  PHQ - 2 Score 0 0 0 0 0  Altered sleeping 0  1 0 1  Tired, decreased energy 1  0 0 0  Change in appetite 0  1 0 0  Feeling bad or failure about yourself  0  0 0 0  Trouble concentrating 0  0 0 0  Moving slowly or fidgety/restless 0  0 0 0  Suicidal thoughts 0  0 0 0  PHQ-9 Score 1  2 0 1  Difficult doing work/chores Not difficult at all  Not difficult at all Not difficult at all Not difficult at all    Past Medical History:  Past Medical History:  Diagnosis Date   Arthritis 2024   Asthma    Hyperlipidemia    Hypertension    Sciatica     Surgical History:  Past Surgical History:  Procedure Laterality Date   CHOLECYSTECTOMY     COLONOSCOPY WITH PROPOFOL N/A 06/03/2022   Procedure: COLONOSCOPY WITH PROPOFOL;  Surgeon: Toney Reil, MD;  Location: [facility] ENDOSCOPY;  Service: Gastroenterology;  Laterality: N/A;    Medications:  No current outpatient medications on file prior to visit.   No current facility-administered medications on file prior to visit.    Allergies:  No Known Allergies  Social History:  Social History   Socioeconomic History   Marital status: Married    Spouse name: Not on file   Number of children: Not on file   Years of education: Not on file   Highest education level: Master's degree (e.g., MA, MS, MEng, MEd, MSW, MBA)  Occupational History   Not on file  Tobacco Use   Smoking status: Never   Smokeless tobacco: Never  Vaping Use   Vaping status: Never Used  Substance and Sexual Activity   Alcohol use: Yes    Comment: On occasion/ socially   Drug use: No    Sexual activity: Yes    Birth control/protection: None  Other Topics Concern   Not on file  Social History Narrative   Not on file   Social Drivers of Health   Financial Resource Strain: Low Risk  (09/05/2023)   Overall Financial Resource Strain (CARDIA)    Difficulty of Paying Living Expenses: Not hard at all  Food Insecurity: Unknown (09/05/2023)   Hunger Vital Sign    Worried About Running Out of Food in the Last Year: Not on file    Ran Out of Food in the Last Year: Never true  Transportation Needs: No Transportation Needs (09/05/2023)   PRAPARE - Administrator, Civil Service (Medical): No    Lack of Transportation (Non-Medical): No  Physical Activity: Insufficiently Active (09/05/2023)   Exercise Vital Sign    Days of Exercise per Week: 2 days    Minutes of Exercise per Session: 60 min  Stress: No Stress Concern Present (09/05/2023)   [employer] of Occupational Health - Occupational Stress Questionnaire    Feeling of Stress : Only a little  Social Connections: Unknown (09/05/2023)   Social Connection and Isolation Panel [NHANES]    Frequency of Communication with Friends and Family: More than three times a week    Frequency of Social Gatherings with Friends and Family: Three times a week    Attends Religious Services: Patient declined    Active Member of Clubs or Organizations: Yes    Attends Banker Meetings: More than 4 times per year    Marital Status: Married  Catering manager Violence: Not At Risk (09/05/2023)   Humiliation, Afraid, Rape, and Kick questionnaire    Fear of Current or Ex-Partner: No    Emotionally Abused: No    Physically Abused: No    Sexually Abused: No   Social History   Tobacco Use  Smoking Status Never  Smokeless Tobacco Never   Social History   Substance and Sexual Activity  Alcohol Use Yes   Comment: On occasion/ socially    Family History:  Family History  Problem Relation Age of Onset    Hypertension Mother     Past medical history, surgical history, medications, allergies, family history and social history reviewed with patient today and changes made to appropriate areas of the chart.   Review of Systems  Constitutional: Negative.   HENT:  Positive for congestion and sore throat. Negative for ear discharge, ear pain, hearing loss, nosebleeds, sinus pain and tinnitus.   Eyes: Negative.   Respiratory:  Positive for cough. Negative for hemoptysis, sputum production, shortness of breath, wheezing and stridor.  Cardiovascular: Negative.   Gastrointestinal:  Positive for heartburn (with food choices). Negative for abdominal pain, blood in stool, constipation, diarrhea, melena, nausea and vomiting.  Genitourinary: Negative.   Musculoskeletal: Negative.   Skin: Negative.   Neurological:  Positive for dizziness. Negative for tingling, tremors, sensory change, speech change, focal weakness, seizures, loss of consciousness, weakness and headaches.  Endo/Heme/Allergies: Negative.   Psychiatric/Behavioral: Negative.     All other ROS negative except what is listed above and in the HPI.      Objective:    BP 120/80 (BP Location: Left Arm, Patient Position: Sitting, Cuff Size: Large)   Pulse 82   Temp 98.6 F (37 C) (Oral)   Resp 14   Ht 5' 2.52" (1.588 m)   Wt 206 lb 3.2 oz (93.5 kg)   SpO2 98%   BMI 37.09 kg/m   Wt Readings from Last 3 Encounters:  09/05/23 206 lb 3.2 oz (93.5 kg)  02/20/23 200 lb 9.6 oz (91 kg)  08/22/22 198 lb 3.2 oz (89.9 kg)    Physical Exam Vitals and nursing note reviewed.  Constitutional:      General: He is not in acute distress.    Appearance: Normal appearance. He is obese. He is not ill-appearing, toxic-appearing or diaphoretic.  HENT:     Head: Normocephalic and atraumatic.     Right Ear: Tympanic membrane, ear canal and external ear normal. There is no impacted cerumen.     Left Ear: Tympanic membrane, ear canal and external ear  normal. There is no impacted cerumen.     Nose: Nose normal. No congestion or rhinorrhea.     Mouth/Throat:     Mouth: Mucous membranes are moist.     Pharynx: Oropharynx is clear. No oropharyngeal exudate or posterior oropharyngeal erythema.  Eyes:     General: No scleral icterus.       Right eye: No discharge.        Left eye: No discharge.     Extraocular Movements: Extraocular movements intact.     Conjunctiva/sclera: Conjunctivae normal.     Pupils: Pupils are equal, round, and reactive to light.  Neck:     Vascular: No carotid bruit.  Cardiovascular:     Rate and Rhythm: Normal rate and regular rhythm.     Pulses: Normal pulses.     Heart sounds: No murmur heard.    No friction rub. No gallop.  Pulmonary:     Effort: Pulmonary effort is normal. No respiratory distress.     Breath sounds: Normal breath sounds. No stridor. No wheezing, rhonchi or rales.  Chest:     Chest wall: No tenderness.  Abdominal:     General: Abdomen is flat. Bowel sounds are normal. There is no distension.     Palpations: Abdomen is soft. There is no mass.     Tenderness: There is no abdominal tenderness. There is no right CVA tenderness, left CVA tenderness, guarding or rebound.     Hernia: No hernia is present.  Genitourinary:    Comments: Genital exam deferred with shared decision making Musculoskeletal:        General: No swelling, tenderness, deformity or signs of injury.     Cervical back: Normal range of motion and neck supple. No rigidity. No muscular tenderness.     Right lower leg: No edema.     Left lower leg: No edema.  Lymphadenopathy:     Cervical: No cervical adenopathy.  Skin:    General: Skin is  warm and dry.     Capillary Refill: Capillary refill takes less than 2 seconds.     Coloration: Skin is not jaundiced or pale.     Findings: No bruising, erythema, lesion or rash.  Neurological:     General: No focal deficit present.     Mental Status: He is alert and oriented to  person, place, and time.     Cranial Nerves: No cranial nerve deficit.     Sensory: No sensory deficit.     Motor: No weakness.     Coordination: Coordination normal.     Gait: Gait normal.     Deep Tendon Reflexes: Reflexes normal.  Psychiatric:        Mood and Affect: Mood normal.        Behavior: Behavior normal.        Thought Content: Thought content normal.        Judgment: Judgment normal.     Results for orders placed or performed in visit on 02/20/23  Lipid Panel w/o Chol/HDL Ratio   Collection Time: 02/20/23  9:02 AM  Result Value Ref Range   Cholesterol, Total 127 100 - 199 mg/dL   Triglycerides 782 (HH) 0 - 149 mg/dL   HDL 17 (L) >95 mg/dL   VLDL Cholesterol Cal Comment (A) 5 - 40 mg/dL   LDL Chol Calc (NIH) Comment (A) 0 - 99 mg/dL   LDL CALC COMMENT: Comment   Comprehensive metabolic panel   Collection Time: 02/20/23  9:02 AM  Result Value Ref Range   Glucose 114 (H) 70 - 99 mg/dL   BUN 13 6 - 24 mg/dL   Creatinine, Ser 6.21 0.76 - 1.27 mg/dL   eGFR 308 >65 HQ/ION/6.29   BUN/Creatinine Ratio 15 9 - 20   Sodium 139 134 - 144 mmol/L   Potassium 3.9 3.5 - 5.2 mmol/L   Chloride 104 96 - 106 mmol/L   CO2 21 20 - 29 mmol/L   Calcium 8.9 8.7 - 10.2 mg/dL   Total Protein 6.7 6.0 - 8.5 g/dL   Albumin 4.4 4.1 - 5.1 g/dL   Globulin, Total 2.3 1.5 - 4.5 g/dL   Bilirubin Total 0.5 0.0 - 1.2 mg/dL   Alkaline Phosphatase 91 44 - 121 IU/L   AST 27 0 - 40 IU/L   ALT 51 (H) 0 - 44 IU/L      Assessment & Plan:   Problem List Items Addressed This Visit       Other   Hyperlipidemia   Under good control on current regimen. Continue current regimen. Continue to monitor. Call with any concerns. Refills given. Labs drawn today.        Relevant Medications   atorvastatin (LIPITOR) 80 MG tablet   omega-3 acid ethyl esters (LOVAZA) 1 g capsule   Other Relevant Orders   Lipid Panel w/o Chol/HDL Ratio   Other Visit Diagnoses       Routine general medical examination  at a health care facility    -  Primary   Vaccines up to date. Screening labs checked today. Colonoscopy up to date. Continue diet and exercise. Call with any concerns.   Relevant Orders   Comprehensive metabolic panel   CBC with Differential/Platelet   Lipid Panel w/o Chol/HDL Ratio   PSA   TSH     Dizziness       Will check labs and monitor. If getting worse, will get imaging of head and neck. Call with any concerns. Continue  to monitor.   Relevant Orders   Bayer DCA Hb A1c Waived   Magnesium   Phosphorus     Upper respiratory tract infection, unspecified type       Flu and strep negative. Await COVID. Will treat symptomatically with steroids. Call with any concerns.   Relevant Orders   Veritor Flu A/B Waived   Rapid Strep Screen ([facility] ONLY)   Novel Coronavirus, NAA ([facility])        LABORATORY TESTING:  Health maintenance labs ordered today as discussed above.   The natural history of prostate cancer and ongoing controversy regarding screening and potential treatment outcomes of prostate cancer has been discussed with the patient. The meaning of a false positive PSA and a false negative PSA has been discussed. He indicates understanding of the limitations of this screening test and wishes to proceed with screening PSA testing.   IMMUNIZATIONS:   - Tdap: Tetanus vaccination status reviewed: last tetanus booster within 10 years. - Influenza: Up to date - Pneumovax: Not applicable - Prevnar: Not applicable - COVID: Up to date - HPV: Not applicable - Shingrix vaccine: Not applicable  SCREENING: - Colonoscopy: Up to date  Discussed with patient purpose of the colonoscopy is to detect colon cancer at curable precancerous or early stages   PATIENT COUNSELING:    Sexuality: Discussed sexually transmitted diseases, partner selection, use of condoms, avoidance of unintended pregnancy  and contraceptive alternatives.   Advised to avoid cigarette smoking.  I discussed  with the patient that most people either abstain from alcohol or drink within safe limits (<=14/week and <=4 drinks/occasion for males, <=7/weeks and <= 3 drinks/occasion for females) and that the risk for alcohol disorders and other health effects rises proportionally with the number of drinks per week and how often a drinker exceeds daily limits.  Discussed cessation/primary prevention of drug use and availability of treatment for abuse.   Diet: Encouraged to adjust caloric intake to maintain  or achieve ideal body weight, to reduce intake of dietary saturated fat and total fat, to limit sodium intake by avoiding high sodium foods and not adding table salt, and to maintain adequate dietary potassium and calcium preferably from fresh fruits, vegetables, and low-fat dairy products.    stressed the importance of regular exercise  Injury prevention: Discussed safety belts, safety helmets, smoke detector, smoking near bedding or upholstery.   Dental health: Discussed importance of regular tooth brushing, flossing, and dental visits.   Follow up plan: NEXT PREVENTATIVE PHYSICAL DUE IN 1 YEAR. Return in about 6 months (around 03/04/2024).

## 2023-09-05 NOTE — Assessment & Plan Note (Signed)
Under good control on current regimen. Continue current regimen. Continue to monitor. Call with any concerns. Refills given. Labs drawn today.

## 2023-09-06 ENCOUNTER — Encounter: Payer: Self-pay | Admitting: Family Medicine

## 2023-09-06 LAB — CBC WITH DIFFERENTIAL/PLATELET
Basophils Absolute: 0.1 10*3/uL (ref 0.0–0.2)
Basos: 1 %
EOS (ABSOLUTE): 0.1 10*3/uL (ref 0.0–0.4)
Eos: 2 %
Hematocrit: 44.5 % (ref 37.5–51.0)
Hemoglobin: 15.6 g/dL (ref 13.0–17.7)
Immature Grans (Abs): 0 10*3/uL (ref 0.0–0.1)
Immature Granulocytes: 1 %
Lymphocytes Absolute: 2 10*3/uL (ref 0.7–3.1)
Lymphs: 31 %
MCH: 31 pg (ref 26.6–33.0)
MCHC: 35.1 g/dL (ref 31.5–35.7)
MCV: 88 fL (ref 79–97)
Monocytes Absolute: 0.5 10*3/uL (ref 0.1–0.9)
Monocytes: 8 %
Neutrophils Absolute: 3.6 10*3/uL (ref 1.4–7.0)
Neutrophils: 57 %
Platelets: 164 10*3/uL (ref 150–450)
RBC: 5.04 x10E6/uL (ref 4.14–5.80)
RDW: 13.7 % (ref 11.6–15.4)
WBC: 6.3 10*3/uL (ref 3.4–10.8)

## 2023-09-06 LAB — LIPID PANEL W/O CHOL/HDL RATIO
Cholesterol, Total: 124 mg/dL (ref 100–199)
HDL: 22 mg/dL — ABNORMAL LOW (ref >39)
LDL Chol Calc (NIH): 11 mg/dL (ref 0–99)
Triglycerides: 744 mg/dL (ref 0–149)
VLDL Cholesterol Cal: 91 mg/dL — ABNORMAL HIGH (ref 5–40)

## 2023-09-06 LAB — COMPREHENSIVE METABOLIC PANEL
ALT: 57 [IU]/L — ABNORMAL HIGH (ref 0–44)
AST: 34 [IU]/L (ref 0–40)
Albumin: 4.4 g/dL (ref 4.1–5.1)
Alkaline Phosphatase: 92 [IU]/L (ref 44–121)
BUN/Creatinine Ratio: 17 (ref 9–20)
BUN: 11 mg/dL (ref 6–24)
Bilirubin Total: 0.6 mg/dL (ref 0.0–1.2)
CO2: 20 mmol/L (ref 20–29)
Calcium: 9.5 mg/dL (ref 8.7–10.2)
Chloride: 105 mmol/L (ref 96–106)
Creatinine, Ser: 0.65 mg/dL — ABNORMAL LOW (ref 0.76–1.27)
Globulin, Total: 2.2 g/dL (ref 1.5–4.5)
Glucose: 138 mg/dL — ABNORMAL HIGH (ref 70–99)
Potassium: 4 mmol/L (ref 3.5–5.2)
Sodium: 141 mmol/L (ref 134–144)
Total Protein: 6.6 g/dL (ref 6.0–8.5)
eGFR: 116 mL/min/{1.73_m2} (ref >59)

## 2023-09-06 LAB — TSH: TSH: 2.32 u[IU]/mL (ref 0.450–4.500)

## 2023-09-06 LAB — PSA: Prostate Specific Ag, Serum: 0.9 ng/mL (ref 0.0–4.0)

## 2023-09-06 LAB — PHOSPHORUS: Phosphorus: 4.4 mg/dL — ABNORMAL HIGH (ref 2.8–4.1)

## 2023-09-06 LAB — NOVEL CORONAVIRUS, NAA: SARS-CoV-2, NAA: NOT DETECTED

## 2023-09-06 LAB — MAGNESIUM: Magnesium: 2.2 mg/dL (ref 1.6–2.3)

## 2023-09-08 LAB — VERITOR FLU A/B WAIVED
Influenza A: NEGATIVE
Influenza B: NEGATIVE

## 2023-09-08 LAB — RAPID STREP SCREEN (MED CTR MEBANE ONLY): Strep Gp A Ag, IA W/Reflex: NEGATIVE

## 2023-09-08 LAB — CULTURE, GROUP A STREP

## 2023-11-22 ENCOUNTER — Encounter: Payer: Self-pay | Admitting: Family Medicine

## 2023-11-22 ENCOUNTER — Telehealth: Admitting: Nurse Practitioner

## 2023-11-22 DIAGNOSIS — Z20818 Contact with and (suspected) exposure to other bacterial communicable diseases: Secondary | ICD-10-CM

## 2023-11-22 MED ORDER — BENZONATATE 100 MG PO CAPS: 100.0000 mg | ORAL_CAPSULE | Freq: Three times a day (TID) | ORAL | 0 refills | Status: DC | PRN

## 2023-11-22 MED ORDER — AZITHROMYCIN 250 MG PO TABS
ORAL_TABLET | ORAL | 0 refills | Status: AC
Start: 2023-11-22 — End: 2023-11-27

## 2023-11-22 NOTE — Progress Notes (Signed)
 Virtual Visit Consent   Angel Montes, you are scheduled for a virtual visit with a [facility] provider today. Just as with appointments in the office, your consent must be obtained to participate. Your consent will be active for this visit and any virtual visit you may have with one of our providers in the next 365 days. If you have a MyChart account, a copy of this consent can be sent to you electronically.  As this is a virtual visit, video technology does not allow for your provider to perform a traditional examination. This may limit your provider's ability to fully assess your condition. If your provider identifies any concerns that need to be evaluated in person or the need to arrange testing (such as labs, EKG, etc.), we will make arrangements to do so. Although advances in technology are sophisticated, we cannot ensure that it will always work on either your end or our end. If the connection with a video visit is poor, the visit may have to be switched to a telephone visit. With either a video or telephone visit, we are not always able to ensure that we have a secure connection.  By engaging in this virtual visit, you consent to the provision of healthcare and authorize for your insurance to be billed (if applicable) for the services provided during this visit. Depending on your insurance coverage, you may receive a charge related to this service.  I need to obtain your verbal consent now. Are you willing to proceed with your visit today? Martrell Rummler has provided verbal consent on 11/22/2023 for a virtual visit (video or telephone). Mardene Shake, FNP  Date: 11/22/2023 5:42 PM   Virtual Visit via Video Note   I, Mardene Shake, connected with  Angel Montes  ([id number], 09/04/1975) on 11/22/23 at  5:45 PM EDT by a video-enabled telemedicine application and verified that I am speaking with the correct person using two identifiers.  Location: Patient: Virtual Visit Location Patient:  Home Provider: Virtual Visit Location Provider: Home Office   I discussed the limitations of evaluation and management by telemedicine and the availability of in person appointments. The patient expressed understanding and agreed to proceed.    History of Present Illness: Angel Montes is a 48 y.o. who identifies as a male who was assigned male at birth, and is being seen today for exposure to Pertussis.   He is a Runner, broadcasting/film/video in [place]  Received notification of a close contact with pertussis  His daughter tested positive  Patient started to have nasal congestion and cough today   Tdap was most recently received in 2023    Problems:  Patient Active Problem List   Diagnosis Date Noted   Tinnitus of both ears 08/22/2022   Positive colorectal cancer screening using Cologuard test 06/03/2022   Adenomatous polyp of ascending colon 06/03/2022   Hyperlipidemia     Allergies: No Known Allergies Medications:  Current Outpatient Medications:    atorvastatin  (LIPITOR) 80 MG tablet, Take 1 tablet (80 mg total) by mouth daily., Disp: 90 tablet, Rfl: 1   meloxicam  (MOBIC ) 15 MG tablet, Take 1 tablet (15 mg total) by mouth daily., Disp: 90 tablet, Rfl: 1   omega-3 acid ethyl esters (LOVAZA ) 1 g capsule, Take 2 capsules (2 g total) by mouth 2 (two) times daily., Disp: 180 capsule, Rfl: 1   predniSONE  (DELTASONE ) 50 MG tablet, Take 1 tablet (50 mg total) by mouth daily with breakfast., Disp: 5 tablet, Rfl: 0  Observations/Objective: Patient is well-developed,  well-nourished in no acute distress.  Resting comfortably  at home.  Head is normocephalic, atraumatic.  No labored breathing.  Speech is clear and coherent with logical content.  Patient is alert and oriented at baseline.    Assessment and Plan:  1. Pertussis exposure (Primary) Health Department form completed and submitted to [facility]  Patient is planning to get tested per health department recommendation   - azithromycin  (ZITHROMAX) 250 MG tablet; Take 2 tablets on day 1, then 1 tablet daily on days 2 through 5  Dispense: 6 tablet; Refill: 0 - benzonatate (TESSALON) 100 MG capsule; Take 1 capsule (100 mg total) by mouth 3 (three) times daily as needed.  Dispense: 30 capsule; Refill: 0    Follow Up Instructions: I discussed the assessment and treatment plan with the patient. The patient was provided an opportunity to ask questions and all were answered. The patient agreed with the plan and demonstrated an understanding of the instructions.  A copy of instructions were sent to the patient via MyChart unless otherwise noted below.    The patient was advised to call back or seek an in-person evaluation if the symptoms worsen or if the condition fails to improve as anticipated.    Mardene Shake, FNP

## 2023-11-23 ENCOUNTER — Encounter: Payer: Self-pay | Admitting: Nurse Practitioner

## 2023-11-23 ENCOUNTER — Telehealth: Admitting: Nurse Practitioner

## 2023-11-23 DIAGNOSIS — Z20818 Contact with and (suspected) exposure to other bacterial communicable diseases: Secondary | ICD-10-CM | POA: Diagnosis not present

## 2023-11-23 DIAGNOSIS — R051 Acute cough: Secondary | ICD-10-CM | POA: Diagnosis not present

## 2023-11-23 NOTE — Telephone Encounter (Signed)
 Patient scheduled for virtual visit and testing.

## 2023-11-23 NOTE — Progress Notes (Signed)
 There were no vitals taken for this visit.   Subjective:    Patient ID: Angel Montes, male    DOB: 10/22/1975, 48 y.o.   MRN: [id number]  HPI: Angel Montes is a 48 y.o. male  Chief Complaint  Patient presents with   Cough   Patient seen today via telehealth visit with complaints of exposure to pertussis.  Patient was seen via a telehealth visit yesterday and prescribed azithromycin .  Patient was contacted by the health department because his daughter has close contact with Pertussis.  She did test positive and therefore the rest of the family has been treated.  Patient is symptomatic with fatigue and a cough.  He did have a TDAP in 2023.    Relevant past medical, surgical, family and social history reviewed and updated as indicated. Interim medical history since our last visit reviewed. Allergies and medications reviewed and updated.  Review of Systems  Constitutional:  Positive for fatigue.  Respiratory:  Positive for cough.     Per HPI unless specifically indicated above     Objective:    There were no vitals taken for this visit.  Wt Readings from Last 3 Encounters:  09/05/23 206 lb 3.2 oz (93.5 kg)  02/20/23 200 lb 9.6 oz (91 kg)  08/22/22 198 lb 3.2 oz (89.9 kg)    Physical Exam Vitals and nursing note reviewed.  Constitutional:      General: He is not in acute distress.    Appearance: He is not ill-appearing.  HENT:     Head: Normocephalic.     Right Ear: Hearing normal.     Left Ear: Hearing normal.     Nose: Nose normal.  Pulmonary:     Effort: Pulmonary effort is normal. No respiratory distress.  Neurological:     Mental Status: He is alert.  Psychiatric:        Mood and Affect: Mood normal.        Behavior: Behavior normal.        Thought Content: Thought content normal.        Judgment: Judgment normal.     Results for orders placed or performed in visit on 09/05/23  Rapid Strep Screen ([facility] ONLY)   Collection Time: 09/05/23  3:29  PM   Specimen: Other   Other  Result Value Ref Range   Strep Gp A Ag, IA W/Reflex Negative Negative  Culture, Group A Strep   Collection Time: 09/05/23  3:29 PM   Other  Result Value Ref Range   Strep A Culture Negative   Veritor Flu A/B Waived   Collection Time: 09/05/23  3:29 PM  Result Value Ref Range   Influenza A Negative Negative   Influenza B Negative Negative  Comprehensive metabolic panel   Collection Time: 09/05/23  3:31 PM  Result Value Ref Range   Glucose 138 (H) 70 - 99 mg/dL   BUN 11 6 - 24 mg/dL   Creatinine, Ser 4.09 (L) 0.76 - 1.27 mg/dL   eGFR 811 >91 YN/WGN/5.62   BUN/Creatinine Ratio 17 9 - 20   Sodium 141 134 - 144 mmol/L   Potassium 4.0 3.5 - 5.2 mmol/L   Chloride 105 96 - 106 mmol/L   CO2 20 20 - 29 mmol/L   Calcium  9.5 8.7 - 10.2 mg/dL   Total Protein 6.6 6.0 - 8.5 g/dL   Albumin 4.4 4.1 - 5.1 g/dL   Globulin, Total 2.2 1.5 - 4.5 g/dL   Bilirubin Total  0.6 0.0 - 1.2 mg/dL   Alkaline Phosphatase 92 44 - 121 IU/L   AST 34 0 - 40 IU/L   ALT 57 (H) 0 - 44 IU/L  CBC with Differential/Platelet   Collection Time: 09/05/23  3:31 PM  Result Value Ref Range   WBC 6.3 3.4 - 10.8 x10E3/uL   RBC 5.04 4.14 - 5.80 x10E6/uL   Hemoglobin 15.6 13.0 - 17.7 g/dL   Hematocrit 04.5 40.9 - 51.0 %   MCV 88 79 - 97 fL   MCH 31.0 26.6 - 33.0 pg   MCHC 35.1 31.5 - 35.7 g/dL   RDW 81.1 91.4 - 78.2 %   Platelets 164 150 - 450 x10E3/uL   Neutrophils 57 Not Estab. %   Lymphs 31 Not Estab. %   Monocytes 8 Not Estab. %   Eos 2 Not Estab. %   Basos 1 Not Estab. %   Neutrophils Absolute 3.6 1.4 - 7.0 x10E3/uL   Lymphocytes Absolute 2.0 0.7 - 3.1 x10E3/uL   Monocytes Absolute 0.5 0.1 - 0.9 x10E3/uL   EOS (ABSOLUTE) 0.1 0.0 - 0.4 x10E3/uL   Basophils Absolute 0.1 0.0 - 0.2 x10E3/uL   Immature Granulocytes 1 Not Estab. %   Immature Grans (Abs) 0.0 0.0 - 0.1 x10E3/uL  Lipid Panel w/o Chol/HDL Ratio   Collection Time: 09/05/23  3:31 PM  Result Value Ref Range    Cholesterol, Total 124 100 - 199 mg/dL   Triglycerides 956 (HH) 0 - 149 mg/dL   HDL 22 (L) >21 mg/dL   VLDL Cholesterol Cal 91 (H) 5 - 40 mg/dL   LDL Chol Calc (NIH) 11 0 - 99 mg/dL  PSA   Collection Time: 09/05/23  3:31 PM  Result Value Ref Range   Prostate Specific Ag, Serum 0.9 0.0 - 4.0 ng/mL  TSH   Collection Time: 09/05/23  3:31 PM  Result Value Ref Range   TSH 2.320 0.450 - 4.500 uIU/mL  Bayer DCA Hb A1c Waived   Collection Time: 09/05/23  3:31 PM  Result Value Ref Range   HB A1C (BAYER DCA - WAIVED) 6.1 (H) 4.8 - 5.6 %  Magnesium   Collection Time: 09/05/23  3:31 PM  Result Value Ref Range   Magnesium 2.2 1.6 - 2.3 mg/dL  Phosphorus   Collection Time: 09/05/23  3:31 PM  Result Value Ref Range   Phosphorus 4.4 (H) 2.8 - 4.1 mg/dL  Novel Coronavirus, NAA ([facility])   Collection Time: 09/05/23  4:53 PM   Specimen: Nasopharyngeal(NP) swabs in vial transport medium  Result Value Ref Range   SARS-CoV-2, NAA Not Detected Not Detected      Assessment & Plan:   Problem List Items Addressed This Visit   None Visit Diagnoses       Acute cough    -  Primary   Likely secondary to Pertussis infection.  PCR done during visit. Complete course of azithromycin . Letter written for patient during visit.     Exposure to pertussis       Relevant Orders   Bordetella Pertussis PCR        Follow up plan: No follow-ups on file.  This visit was completed via MyChart due to the restrictions of the COVID-19 pandemic. All issues as above were discussed and addressed. Physical exam was done as above through visual confirmation on MyChart. If it was felt that the patient should be evaluated in the office, they were directed there. The patient verbally consented to this visit. Location of  the patient: Home Location of the provider: Office Those involved with this call:  Provider: Aileen Alexanders, NP CMA: Valla Gauss, CMA Front Desk/Registration: Jaynee Meyer This encounter was  conducted via video.  I spent 20 mins dedicated to the care of this patient on the date of this encounter to include previsit review of symptoms, plan of care and follow up, face to face time with the patient, and post visit ordering of testing.

## 2023-11-27 ENCOUNTER — Encounter: Payer: Self-pay | Admitting: Nurse Practitioner

## 2023-11-27 LAB — BORDETELLA PERTUSSIS PCR
B. parapertussis DNA: NEGATIVE
B. pertussis DNA: NEGATIVE

## 2024-03-08 ENCOUNTER — Other Ambulatory Visit: Payer: Self-pay | Admitting: Medical Genetics

## 2024-03-08 ENCOUNTER — Ambulatory Visit: Payer: 59 | Admitting: Family Medicine

## 2024-03-12 ENCOUNTER — Other Ambulatory Visit

## 2024-03-14 ENCOUNTER — Ambulatory Visit: Admitting: Family Medicine

## 2024-05-10 ENCOUNTER — Ambulatory Visit: Admitting: Family Medicine

## 2024-05-10 ENCOUNTER — Encounter: Payer: Self-pay | Admitting: Family Medicine

## 2024-05-10 VITALS — BP 123/85 | HR 73 | Temp 98.0°F | Ht 62.5 in | Wt 206.2 lb

## 2024-05-10 DIAGNOSIS — Z23 Encounter for immunization: Secondary | ICD-10-CM | POA: Diagnosis not present

## 2024-05-10 DIAGNOSIS — E663 Overweight: Secondary | ICD-10-CM | POA: Diagnosis not present

## 2024-05-10 DIAGNOSIS — Z789 Other specified health status: Secondary | ICD-10-CM | POA: Diagnosis not present

## 2024-05-10 DIAGNOSIS — R42 Dizziness and giddiness: Secondary | ICD-10-CM

## 2024-05-10 DIAGNOSIS — E782 Mixed hyperlipidemia: Secondary | ICD-10-CM

## 2024-05-10 DIAGNOSIS — M170 Bilateral primary osteoarthritis of knee: Secondary | ICD-10-CM

## 2024-05-10 MED ORDER — MELOXICAM 15 MG PO TABS: 15.0000 mg | ORAL_TABLET | Freq: Every day | ORAL | 1 refills | Status: AC

## 2024-05-10 MED ORDER — FLUTICASONE PROPIONATE 50 MCG/ACT NA SUSP: 2.0000 | Freq: Every day | NASAL | 6 refills | Status: AC

## 2024-05-10 MED ORDER — ATORVASTATIN CALCIUM 80 MG PO TABS: 80.0000 mg | ORAL_TABLET | Freq: Every day | ORAL | 1 refills | Status: AC

## 2024-05-10 MED ORDER — OMEGA-3-ACID ETHYL ESTERS 1 G PO CAPS: 2.0000 | ORAL_CAPSULE | Freq: Two times a day (BID) | ORAL | 1 refills | Status: AC

## 2024-05-10 NOTE — Assessment & Plan Note (Signed)
 Found on x-ray last year. Continue meloxicam . Call if getting worse.

## 2024-05-10 NOTE — Progress Notes (Signed)
 BP 123/85   Pulse 73   Temp 98 F (36.7 C) (Oral)   Ht 5' 2.5 (1.588 m)   Wt 206 lb 3.2 oz (93.5 kg)   SpO2 96%   BMI 37.11 kg/m    Subjective:    Patient ID: Angel Montes, male    DOB: 26-Nov-1975, 48 y.o.   MRN: [id number]  HPI: Angel Montes is a 48 y.o. male  Chief Complaint  Patient presents with   Hyperlipidemia   HYPERLIPIDEMIA Hyperlipidemia status: excellent compliance Satisfied with current treatment?  yes Side effects:  no Medication compliance: excellent compliance Past cholesterol meds: atorvastatin  and lovaza  Supplements: fish oil Aspirin:  no The ASCVD Risk score (Arnett DK, et al., 2019) failed to calculate for the following reasons:   The valid total cholesterol range is 130 to 320 mg/dL Chest pain:  no Coronary artery disease:  no   Relevant past medical, surgical, family and social history reviewed and updated as indicated. Interim medical history since our last visit reviewed. Allergies and medications reviewed and updated.  Review of Systems  Constitutional: Negative.   HENT:  Positive for congestion. Negative for dental problem, drooling, ear discharge, ear pain, facial swelling, hearing loss, mouth sores, nosebleeds, postnasal drip, rhinorrhea, sinus pressure, sinus pain, sneezing, sore throat, tinnitus, trouble swallowing and voice change.   Respiratory: Negative.    Cardiovascular: Negative.   Musculoskeletal:  Positive for arthralgias (with certain positions- like twisting and reaching). Negative for back pain, gait problem, joint swelling, myalgias, neck pain and neck stiffness.  Neurological:  Positive for dizziness. Negative for tremors, seizures, syncope, facial asymmetry, speech difficulty, weakness, light-headedness, numbness and headaches.  Psychiatric/Behavioral: Negative.      Per HPI unless specifically indicated above     Objective:    BP 123/85   Pulse 73   Temp 98 F (36.7 C) (Oral)   Ht 5' 2.5 (1.588 m)   Wt 206  lb 3.2 oz (93.5 kg)   SpO2 96%   BMI 37.11 kg/m   Wt Readings from Last 3 Encounters:  05/10/24 206 lb 3.2 oz (93.5 kg)  09/05/23 206 lb 3.2 oz (93.5 kg)  02/20/23 200 lb 9.6 oz (91 kg)    Physical Exam Vitals and nursing note reviewed.  Constitutional:      General: He is not in acute distress.    Appearance: Normal appearance. He is not ill-appearing, toxic-appearing or diaphoretic.  HENT:     Head: Normocephalic and atraumatic.     Right Ear: Hearing, ear canal and external ear normal. A middle ear effusion is present.     Left Ear: Hearing, tympanic membrane, ear canal and external ear normal.     Nose: Nose normal.     Mouth/Throat:     Mouth: Mucous membranes are moist.     Pharynx: Oropharynx is clear.  Eyes:     General: No scleral icterus.       Right eye: No discharge.        Left eye: No discharge.     Extraocular Movements: Extraocular movements intact.     Conjunctiva/sclera: Conjunctivae normal.     Pupils: Pupils are equal, round, and reactive to light.  Cardiovascular:     Rate and Rhythm: Normal rate and regular rhythm.     Pulses: Normal pulses.     Heart sounds: Normal heart sounds. No murmur heard.    No friction rub. No gallop.  Pulmonary:     Effort: Pulmonary effort is normal.  No respiratory distress.     Breath sounds: Normal breath sounds. No stridor. No wheezing, rhonchi or rales.  Chest:     Chest wall: No tenderness.  Musculoskeletal:        General: Normal range of motion.     Cervical back: Normal range of motion and neck supple.  Skin:    General: Skin is warm and dry.     Capillary Refill: Capillary refill takes less than 2 seconds.     Coloration: Skin is not jaundiced or pale.     Findings: No bruising, erythema, lesion or rash.  Neurological:     General: No focal deficit present.     Mental Status: He is alert and oriented to person, place, and time. Mental status is at baseline.  Psychiatric:        Mood and Affect: Mood normal.         Behavior: Behavior normal.        Thought Content: Thought content normal.        Judgment: Judgment normal.     Results for orders placed or performed in visit on 11/23/23  Bordetella Pertussis PCR   Collection Time: 11/23/23  2:53 PM   Specimen: Nasopharyngeal  Result Value Ref Range   B. pertussis DNA Negative Negative   B. parapertussis DNA Negative Negative      Assessment & Plan:   Problem List Items Addressed This Visit       Musculoskeletal and Integument   Primary osteoarthritis of both knees   Found on x-ray last year. Continue meloxicam . Call if getting worse.       Relevant Medications   meloxicam  (MOBIC ) 15 MG tablet     Other   Hyperlipidemia - Primary   Under good control on current regimen. Continue current regimen. Continue to monitor. Call with any concerns. Refills given. Labs drawn today.        Relevant Medications   atorvastatin  (LIPITOR) 80 MG tablet   omega-3 acid ethyl esters (LOVAZA ) 1 g capsule   Other Relevant Orders   Lipid Panel w/o Chol/HDL Ratio   Comprehensive metabolic panel with GFR   CBC with Differential/Platelet   Other Visit Diagnoses       Dizziness       Likely due to middle ear effusion on the R. Start flonase. Call with any concerns.     Hepatitis B vaccination status unknown       Labs drawn today. Await results.   Relevant Orders   Hepatitis B surface antibody,quantitative     Overweight       COVID shot given today.   Relevant Orders   Pfizer Comirnaty Covid -19 Vaccine 29yrs and older     Needs flu shot       Flu shot given today.   Relevant Orders   Flu vaccine trivalent PF, 6mos and older(Flulaval,Afluria,Fluarix,Fluzone)        Follow up plan: Return in about 6 months (around 11/08/2024) for physical.

## 2024-05-10 NOTE — Assessment & Plan Note (Signed)
 Under good control on current regimen. Continue current regimen. Continue to monitor. Call with any concerns. Refills given. Labs drawn today.

## 2024-05-11 LAB — COMPREHENSIVE METABOLIC PANEL WITH GFR
ALT: 46 IU/L — ABNORMAL HIGH (ref 0–44)
AST: 26 IU/L (ref 0–40)
Albumin: 4.3 g/dL (ref 4.1–5.1)
Alkaline Phosphatase: 90 IU/L (ref 47–123)
BUN/Creatinine Ratio: 15 (ref 9–20)
BUN: 13 mg/dL (ref 6–24)
Bilirubin Total: 0.9 mg/dL (ref 0.0–1.2)
CO2: 22 mmol/L (ref 20–29)
Calcium: 9.1 mg/dL (ref 8.7–10.2)
Chloride: 104 mmol/L (ref 96–106)
Creatinine, Ser: 0.87 mg/dL (ref 0.76–1.27)
Globulin, Total: 2.1 g/dL (ref 1.5–4.5)
Glucose: 124 mg/dL — ABNORMAL HIGH (ref 70–99)
Potassium: 3.6 mmol/L (ref 3.5–5.2)
Sodium: 139 mmol/L (ref 134–144)
Total Protein: 6.4 g/dL (ref 6.0–8.5)
eGFR: 105 mL/min/1.73 (ref >59)

## 2024-05-11 LAB — CBC WITH DIFFERENTIAL/PLATELET
Basophils Absolute: 0.1 x10E3/uL (ref 0.0–0.2)
Basos: 1 %
EOS (ABSOLUTE): 0 x10E3/uL (ref 0.0–0.4)
Eos: 1 %
Hematocrit: 43.5 % (ref 37.5–51.0)
Hemoglobin: 15.1 g/dL (ref 13.0–17.7)
Immature Grans (Abs): 0 x10E3/uL (ref 0.0–0.1)
Immature Granulocytes: 0 %
Lymphocytes Absolute: 1.9 x10E3/uL (ref 0.7–3.1)
Lymphs: 32 %
MCH: 31.7 pg (ref 26.6–33.0)
MCHC: 34.7 g/dL (ref 31.5–35.7)
MCV: 91 fL (ref 79–97)
Monocytes Absolute: 0.4 x10E3/uL (ref 0.1–0.9)
Monocytes: 7 %
Neutrophils Absolute: 3.5 x10E3/uL (ref 1.4–7.0)
Neutrophils: 59 %
Platelets: 161 x10E3/uL (ref 150–450)
RBC: 4.76 x10E6/uL (ref 4.14–5.80)
RDW: 14.1 % (ref 11.6–15.4)
WBC: 5.9 x10E3/uL (ref 3.4–10.8)

## 2024-05-11 LAB — LIPID PANEL W/O CHOL/HDL RATIO
Cholesterol, Total: 116 mg/dL (ref 100–199)
HDL: 19 mg/dL — ABNORMAL LOW (ref >39)
LDL Chol Calc (NIH): 10 mg/dL (ref 0–99)
Triglycerides: 703 mg/dL (ref 0–149)
VLDL Cholesterol Cal: 87 mg/dL — ABNORMAL HIGH (ref 5–40)

## 2024-05-11 LAB — HEPATITIS B SURFACE ANTIBODY, QUANTITATIVE: Hepatitis B Surf Ab Quant: <3.5 m[IU]/mL — ABNORMAL LOW

## 2024-05-12 ENCOUNTER — Ambulatory Visit: Payer: Self-pay | Admitting: Family Medicine

## 2024-05-13 ENCOUNTER — Telehealth: Payer: Self-pay | Admitting: Pharmacy Technician

## 2024-05-13 ENCOUNTER — Other Ambulatory Visit (HOSPITAL_COMMUNITY): Payer: Self-pay

## 2024-05-13 NOTE — Telephone Encounter (Signed)
 Pharmacy Patient Advocate Encounter   Received notification from Onbase that prior authorization for Omega-3-acid  Ethyl Esters 1GM capsules is required/requested.   Insurance verification completed.   The patient is insured through [facility].   Per test claim: PA required; PA submitted to above mentioned insurance via Latent Key/confirmation #/EOC [id number] Status is pending

## 2024-05-13 NOTE — Telephone Encounter (Signed)
 Pharmacy Patient Advocate Encounter  Received notification from [facility] that Prior Authorization for Omega-3-acid  Ethyl Esters 1GM capsules has been APPROVED from 05/13/24 to 05/14/27. Ran test claim, Copay is $10.00. This test claim was processed through [facility]- copay amounts may vary at other pharmacies due to pharmacy/plan contracts, or as the patient moves through the different stages of their insurance plan.   PA #/Case ID/Reference #: [id number]

## 2024-05-15 ENCOUNTER — Other Ambulatory Visit: Payer: Self-pay | Admitting: Medical Genetics

## 2024-05-15 DIAGNOSIS — Z006 Encounter for examination for normal comparison and control in clinical research program: Secondary | ICD-10-CM

## 2024-11-11 ENCOUNTER — Ambulatory Visit: Admitting: Family Medicine
